# Patient Record
Sex: Female | Born: 2000 | Race: White | Hispanic: No | Marital: Single | Smoking: Never smoker
Health system: Southern US, Community
[De-identification: ages and names within clinical notes are randomized; demographics above are authoritative.]

## PROBLEM LIST (undated history)

## (undated) DIAGNOSIS — F411 Generalized anxiety disorder: Secondary | ICD-10-CM

## (undated) DIAGNOSIS — F902 Attention-deficit hyperactivity disorder, combined type: Principal | ICD-10-CM

## (undated) DIAGNOSIS — R278 Other lack of coordination: Secondary | ICD-10-CM

## (undated) HISTORY — DX: Other lack of coordination: R27.8

## (undated) HISTORY — DX: Generalized anxiety disorder: F41.1

## (undated) HISTORY — DX: Attention-deficit hyperactivity disorder, combined type: F90.2

---
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
## 2018-01-26 ENCOUNTER — Other Ambulatory Visit: Payer: Self-pay

## 2018-01-26 MED ORDER — LISDEXAMFETAMINE DIMESYLATE 30 MG PO CAPS: 30.0000 mg | ORAL_CAPSULE | Freq: Every morning | ORAL | 0 refills | Status: DC

## 2018-01-26 MED ORDER — VYVANSE 70 MG PO CAPS: 70.0000 mg | ORAL_CAPSULE | Freq: Every morning | ORAL | 0 refills | Status: DC

## 2018-01-26 NOTE — Telephone Encounter (Signed)
Mom called in for refill for Vyvanse 30mg  and 70mg . Last visit 10/28/2017 next visit 05/10/2018. [facility] on [address]

## 2018-01-26 NOTE — Telephone Encounter (Signed)
RX for above e-scribed and sent to pharmacy on record  [id number] [place], [place] - [address] [place] [address] Phone: [phone] Fax: [phone] 

## 2018-03-01 ENCOUNTER — Other Ambulatory Visit: Payer: Self-pay

## 2018-03-01 MED ORDER — VYVANSE 70 MG PO CAPS: 70.0000 mg | ORAL_CAPSULE | Freq: Every morning | ORAL | 0 refills | Status: DC

## 2018-03-01 MED ORDER — LISDEXAMFETAMINE DIMESYLATE 30 MG PO CAPS: 30.0000 mg | ORAL_CAPSULE | Freq: Every morning | ORAL | 0 refills | Status: DC

## 2018-03-01 NOTE — Telephone Encounter (Signed)
Mom called in for refill for Vyvanse 30mg and 70mg. Last visit 10/28/2017 next visit 05/10/2018. [facility] on [address] 

## 2018-03-26 ENCOUNTER — Other Ambulatory Visit: Payer: Self-pay | Admitting: Pediatrics

## 2018-03-26 NOTE — Telephone Encounter (Signed)
Buspar 10 mg daily, # 30 with 0 RF's .RX for above e-scribed and sent to pharmacy on record  [id number] [place], [place] - [address] [place] [address] Phone: [phone] Fax: [phone]  [id number] [place], [place] - [address] [place] [address] Phone: [phone] Fax: [phone]

## 2018-03-30 ENCOUNTER — Other Ambulatory Visit: Payer: Self-pay

## 2018-03-30 MED ORDER — VYVANSE 70 MG PO CAPS: 70.0000 mg | ORAL_CAPSULE | Freq: Every morning | ORAL | 0 refills | Status: DC

## 2018-03-30 MED ORDER — LISDEXAMFETAMINE DIMESYLATE 30 MG PO CAPS: 30.0000 mg | ORAL_CAPSULE | Freq: Every morning | ORAL | 0 refills | Status: DC

## 2018-03-30 NOTE — Telephone Encounter (Signed)
RX for above e-scribed and sent to pharmacy on record  [id number] [place], [place] - [address] [place] [address] Phone: [phone] Fax: [phone] 

## 2018-03-30 NOTE — Telephone Encounter (Signed)
Mom called in for refill for Vyvanse 30mg and 70mg. Last visit 10/28/2017 next visit 05/10/2018. [facility] on [address] 

## 2018-04-14 ENCOUNTER — Institutional Professional Consult (permissible substitution): Payer: BC Managed Care – PPO | Admitting: Pediatrics

## 2018-04-27 ENCOUNTER — Other Ambulatory Visit: Payer: Self-pay

## 2018-04-27 MED ORDER — LISDEXAMFETAMINE DIMESYLATE 30 MG PO CAPS: 30.0000 mg | ORAL_CAPSULE | Freq: Every morning | ORAL | 0 refills | Status: DC

## 2018-04-27 MED ORDER — VYVANSE 70 MG PO CAPS: 70.0000 mg | ORAL_CAPSULE | Freq: Every morning | ORAL | 0 refills | Status: DC

## 2018-04-27 NOTE — Telephone Encounter (Signed)
Mom called in for refill for Vyvanse 30mg  and 70mg . Last visit 10/28/2017 next visit 05/10/2018. [facility]

## 2018-04-27 NOTE — Telephone Encounter (Signed)
E-Prescribed Vyvanse 70 mg plus Vyvanse 30 mg directly to  [id number] [place], [place] - [address] [place] [id number] Phone: [phone] Fax: [phone]

## 2018-05-10 ENCOUNTER — Ambulatory Visit: Payer: BC Managed Care – PPO | Admitting: Pediatrics

## 2018-05-10 ENCOUNTER — Encounter: Payer: Self-pay | Admitting: Pediatrics

## 2018-05-10 VITALS — BP 124/78 | HR 104 | Ht 66.5 in | Wt 150.0 lb

## 2018-05-10 DIAGNOSIS — F902 Attention-deficit hyperactivity disorder, combined type: Secondary | ICD-10-CM

## 2018-05-10 DIAGNOSIS — R278 Other lack of coordination: Secondary | ICD-10-CM

## 2018-05-10 DIAGNOSIS — F422 Mixed obsessional thoughts and acts: Secondary | ICD-10-CM | POA: Diagnosis not present

## 2018-05-10 DIAGNOSIS — Z719 Counseling, unspecified: Secondary | ICD-10-CM

## 2018-05-10 DIAGNOSIS — Z7189 Other specified counseling: Secondary | ICD-10-CM

## 2018-05-10 DIAGNOSIS — Z79899 Other long term (current) drug therapy: Secondary | ICD-10-CM

## 2018-05-10 DIAGNOSIS — F411 Generalized anxiety disorder: Secondary | ICD-10-CM | POA: Diagnosis not present

## 2018-05-10 MED ORDER — AMPHETAMINE-DEXTROAMPHETAMINE 10 MG PO TABS: 10.0000 mg | ORAL_TABLET | ORAL | 0 refills | Status: DC | PRN

## 2018-05-10 MED ORDER — FLUOXETINE HCL 20 MG PO TABS: 20.0000 mg | ORAL_TABLET | Freq: Every day | ORAL | 0 refills | Status: DC

## 2018-05-10 NOTE — Progress Notes (Signed)
Patient ID: Susan Dyer, female   DOB: 08/13/2000, 17 y.o.   MRN: [id number]  Medical Follow-up  Patient ID: Susan ScoreMallory Budge  DOB: [birth year]  MRN: [id number]  DATE:05/10/18 Chales Salmonees, Janet, MD  Accompanied by: Mother Patient Lives with: mother and sister age 516 years   [facility] [employer] [facility] roommate - towards the end will keep her room but live with BF  HISTORY/CURRENT STATUS: Chief Complaint - Polite and cooperative and present for medical follow up for medication management of ADHD, dysgraphia and  Anxiety.  Currently prescribed Vyvanse 70 and 30 mg and Prozac 20 mg every morning.  Not using Buspar right now.  Had wisdom teeth out Tuesday, now has infection since Friday.  Just prescribed ABX.    EDUCATION: [employer]: [employer]  Year/Grade: Freshman year Biology for majors, Database administratorrinciples of Biology, lab, FYE, Occupational hygienistCommunications, psychology Passed Psych on the line.  Others A, B, B, A, A DSO - smart pen (returned it).  Has computer each class, extended time for test, separate setting  [employer] equestrian team - on the team but not social events the coach supported. Was riding weekly - English and jumping  Has a few friends and in class and a buddy making quizlettes  MEDICAL HISTORY: Appetite: WNL  Sleep: Bedtime: School 2400  Awakens: School no issues Sleep Concerns: Initiation/Maintenance/Other: Asleep easily, sleeps through the night, feels well-rested.  No Sleep concerns. No concerns for toileting. Daily stool, no constipation or diarrhea. Void urine no difficulty. No enuresis.   Participate in daily oral hygiene to include brushing and flossing. Individual Medical History/Review of System Changes? No Now involved with Father, he would visit for Breakfast and buy supplies - not paying for college. He is remarrying  Allergies:  No Known Allergies  Current Medications:  Vyvanse 70 and 30 mg Prozac 20 mg every morning Medication Side Effects: None  Family Medical/Social History Changes?: No  MENTAL  HEALTH: Mental Health Issues:  Denies sadness, loneliness or depression. No self harm or thoughts of self harm or injury. Denies fears, worries and anxieties. Has good peer relations and is not a bully nor is victimized.  ROS: Review of Systems  Constitutional: Negative for fatigue.  HENT: Negative.   Eyes: Negative.   Respiratory: Negative.   Cardiovascular: Negative.   Gastrointestinal: Negative.   Endocrine: Negative.   Genitourinary: Negative.   Musculoskeletal: Negative.   Skin: Negative.   Allergic/Immunologic: Negative.   Neurological: Negative for seizures and headaches.  Hematological: Negative.   Psychiatric/Behavioral: Negative for agitation, behavioral problems, decreased concentration and sleep disturbance. The patient is not nervous/anxious and is not hyperactive.   All other systems reviewed and are negative.  PHYSICAL EXAM: Vitals:   05/10/18 1421  BP: 124/78  Pulse: (!) 104  Weight: 150 lb (68 kg)  Height: 5' 6.5" (1.689 m)   Body mass index is 23.85 kg/m.  General Exam: Physical Exam Vitals signs reviewed.  Constitutional:      General: She is not in acute distress.    Appearance: Normal appearance. She is well-developed.  HENT:     Head: Normocephalic.     Right Ear: Tympanic membrane, ear canal and external ear normal.     Left Ear: Tympanic membrane, ear canal and external ear normal.     Nose: Nose normal.     Mouth/Throat:     Pharynx: Uvula midline.  Eyes:     General: Lids are normal.     Conjunctiva/sclera: Conjunctivae normal.     Pupils: Pupils are equal,  round, and reactive to light.  Neck:     Musculoskeletal: Normal range of motion.     Trachea: Trachea normal.  Cardiovascular:     Rate and Rhythm: Normal rate and regular rhythm.     Pulses: Normal pulses.     Heart sounds: Normal heart sounds.  Pulmonary:     Effort: Pulmonary effort is normal.     Breath sounds: Normal breath sounds.  Abdominal:     General: Bowel sounds  are normal.     Palpations: Abdomen is soft.  Genitourinary:    Comments: Deferred Musculoskeletal: Normal range of motion.  Skin:    General: Skin is warm and dry.  Neurological:     Mental Status: She is alert and oriented to person, place, and time.     Cranial Nerves: No cranial nerve deficit.     Sensory: No sensory deficit.     Motor: No tremor or seizure activity.     Coordination: Coordination normal.     Gait: Gait normal.     Deep Tendon Reflexes: Reflexes are normal and symmetric.  Psychiatric:        Attention and Perception: She is attentive.        Mood and Affect: Mood is not anxious or depressed.        Speech: Speech normal.        Behavior: Behavior normal. Behavior is not hyperactive. Behavior is cooperative.        Thought Content: Thought content normal. Thought content does not include suicidal ideation. Thought content does not include suicidal plan.        Judgment: Judgment normal. Judgment is not impulsive.   Neurological: oriented to place and person  Testing/Developmental Screens: CGI:13/23 Reviewed with patient and mother      DIAGNOSES:    ICD-10-CM   1. ADHD (attention deficit hyperactivity disorder), combined type F90.2   2. Dysgraphia R27.8   3. Generalized anxiety disorder F41.1   4. Mixed obsessional thoughts and acts F42.2   5. Medication management Z79.899   6. Patient counseled Z71.9   7. Parenting dynamics counseling Z71.89   8. Counseling and coordination of care Z71.89     RECOMMENDATIONS:  Patient Instructions  DISCUSSION: Patient and family counseled regarding the following coordination of care items:  Continue medication as directed Vyvanse 70 mg and 30 mg every morning Add adderall 10 mg for evening class as needed Prozac 20 mg every morning RX for above e-scribed and sent to pharmacy on record  [id number] [place], [place] - [address] [place]  [id number] Phone:  [phone] Fax: [phone]   Counseled medication administration, effects, and possible side effects.  ADHD medications discussed to include different medications and pharmacologic properties of each. Recommendation for specific medication to include dose, administration, expected effects, possible side effects and the risk to benefit ratio of medication management.  Advised importance of:  Good sleep hygiene (8- 10 hours per night) Limited screen time (none on school nights, no more than 2 hours on weekends) Regular exercise(outside and active play) Healthy eating (drink water, no sodas/sweet tea, limit portions and no seconds).  Counseling at this visit included the review of old records and/or current chart with the patient and family.   Counseling included the following discussion points presented at every visit to improve understanding and treatment compliance.  Recent health history and today's examination Growth and development with anticipatory guidance provided regarding  brain growth, executive function maturation and pubertal development School progress and continued advocay for appropriate accommodations to include maintain Structure, routine, organization, reward, motivation and consequences.  Additionally the patient was counseled to take medication while driving.    Mother verbalized understanding of all topics discussed.  NEXT APPOINTMENT: Return in about 3 months (around 08/09/2018) for Medical Follow up. Medical Decision-making: More than 50% of the appointment was spent counseling and discussing diagnosis and management of symptoms with the patient and family.  Counseling Time: 40 minutes Total Contact Time: 50 minutes

## 2018-05-10 NOTE — Patient Instructions (Addendum)
DISCUSSION: Patient and family counseled regarding the following coordination of care items:  Continue medication as directed Vyvanse 70 mg and 30 mg every morning Add adderall 10 mg for evening class as needed Prozac 20 mg every morning RX for above e-scribed and sent to pharmacy on record  [id number] [place], [place] - [address] [place] [id number] Phone: [phone] Fax: [phone]  Counseled medication administration, effects, and possible side effects.  ADHD medications discussed to include different medications and pharmacologic properties of each. Recommendation for specific medication to include dose, administration, expected effects, possible side effects and the risk to benefit ratio of medication management.  Advised importance of:  Good sleep hygiene (8- 10 hours per night) Limited screen time (none on school nights, no more than 2 hours on weekends) Regular exercise(outside and active play) Healthy eating (drink water, no sodas/sweet tea, limit portions and no seconds).  Counseling at this visit included the review of old records and/or current chart with the patient and family.   Counseling included the following discussion points presented at every visit to improve understanding and treatment compliance.  Recent health history and today's examination Growth and development with anticipatory guidance provided regarding brain growth, executive function maturation and pubertal development School progress and continued advocay for appropriate accommodations to include maintain Structure, routine, organization, reward, motivation and consequences.  Additionally the patient was counseled to take medication while driving.

## 2018-05-27 ENCOUNTER — Other Ambulatory Visit: Payer: Self-pay

## 2018-05-27 MED ORDER — VYVANSE 70 MG PO CAPS: 70.0000 mg | ORAL_CAPSULE | Freq: Every morning | ORAL | 0 refills | Status: DC

## 2018-05-27 MED ORDER — LISDEXAMFETAMINE DIMESYLATE 30 MG PO CAPS: 30.0000 mg | ORAL_CAPSULE | Freq: Every morning | ORAL | 0 refills | Status: DC

## 2018-05-27 NOTE — Telephone Encounter (Signed)
E-Prescribed Vyvanse 70 and 30 directly to  [id number] [place], [place] - [address] [place] [address] Phone: [phone] Fax: [phone]

## 2018-05-27 NOTE — Telephone Encounter (Signed)
Mom called in for refill for Vyvanse 30mg  and 70mg . Last visit 05/10/2018 next visit 11/09/2018. [facility]

## 2018-06-17 ENCOUNTER — Other Ambulatory Visit: Payer: Self-pay | Admitting: Family

## 2018-06-17 NOTE — Telephone Encounter (Signed)
RX for above e-scribed and sent to pharmacy on record ? ?[id number] [place], [place] - [address] ?[address] ?[place] [address] ?Phone: [phone] Fax: [phone] ?

## 2018-06-17 NOTE — Telephone Encounter (Signed)
Last visit 05/10/2018 next visit 11/09/2018

## 2018-07-12 ENCOUNTER — Other Ambulatory Visit: Payer: Self-pay

## 2018-07-12 MED ORDER — VYVANSE 70 MG PO CAPS: 70.0000 mg | ORAL_CAPSULE | Freq: Every morning | ORAL | 0 refills | Status: DC

## 2018-07-12 MED ORDER — LISDEXAMFETAMINE DIMESYLATE 30 MG PO CAPS: 30.0000 mg | ORAL_CAPSULE | Freq: Every morning | ORAL | 0 refills | Status: DC

## 2018-07-12 NOTE — Telephone Encounter (Signed)
Patient called in for refill for Vyvanse. Last visit 05/11/2019 next visit 11/09/2018. [facility] [place], [place]

## 2018-07-12 NOTE — Telephone Encounter (Signed)
E-Prescribed Vyvanse 70 and 30 mg directly to  [id number] [place], [place] - [address] [place] [address] Phone: [phone] Fax: [phone]

## 2018-08-09 ENCOUNTER — Other Ambulatory Visit: Payer: Self-pay

## 2018-08-09 MED ORDER — LISDEXAMFETAMINE DIMESYLATE 30 MG PO CAPS: 30.0000 mg | ORAL_CAPSULE | Freq: Every morning | ORAL | 0 refills | Status: DC

## 2018-08-09 MED ORDER — VYVANSE 70 MG PO CAPS: 70.0000 mg | ORAL_CAPSULE | Freq: Every morning | ORAL | 0 refills | Status: DC

## 2018-08-09 NOTE — Telephone Encounter (Signed)
Patient called in for refill for Vyvanse. Last visit 05/11/2019 next visit 11/09/2018. [facility] [place], [place] 

## 2018-08-09 NOTE — Telephone Encounter (Signed)
Vyvanse 70 mg and 30 mg each daily, # 30 with no RF's each Rx. RX for above e-scribed and sent to pharmacy on record  [id number] [place], [place] - [address] [place] [address] Phone: [phone] Fax: [phone]

## 2018-08-10 MED ORDER — VYVANSE 70 MG PO CAPS: 70.0000 mg | ORAL_CAPSULE | Freq: Every morning | ORAL | 0 refills | Status: DC

## 2018-08-10 MED ORDER — LISDEXAMFETAMINE DIMESYLATE 30 MG PO CAPS: 30.0000 mg | ORAL_CAPSULE | Freq: Every morning | ORAL | 0 refills | Status: DC

## 2018-08-10 NOTE — Addendum Note (Signed)
Addended by: Elvera Maria R on: 08/10/2018 09:59 AM   Modules accepted: Orders

## 2018-08-10 NOTE — Telephone Encounter (Addendum)
Patient called in and [facility] [address] [place], [place] [address]

## 2018-08-10 NOTE — Telephone Encounter (Signed)
Sent to new Pharmacy.  

## 2018-08-10 NOTE — Addendum Note (Signed)
Addended by: Burgess Estelle on: 08/10/2018 09:28 AM   Modules accepted: Orders

## 2018-08-11 ENCOUNTER — Telehealth: Payer: Self-pay | Admitting: Pediatrics

## 2018-08-11 NOTE — Telephone Encounter (Signed)
Spoke with mother regarding patient visiting girlfriend in [place]. Mother had concerns due to age differences and that she has not met with or knew of girlfriend. Mother did not want patient to go during covid19, so she refused to let her take her car.  Girlfriend came and picked up patient last night.  Counseled mother for how to handle this issue.  Coach and advise, keep assumptions low and support gently. Mother verbalized understanding of all topics discussed.

## 2018-09-08 ENCOUNTER — Other Ambulatory Visit: Payer: Self-pay

## 2018-09-08 MED ORDER — VYVANSE 70 MG PO CAPS: 70.0000 mg | ORAL_CAPSULE | Freq: Every morning | ORAL | 0 refills | Status: DC

## 2018-09-08 NOTE — Telephone Encounter (Signed)
RX for above e-scribed and sent to pharmacy on record  [id number] [place], [place] - [address] [place] [address] Phone: [phone] Fax: [phone]    

## 2018-09-08 NOTE — Telephone Encounter (Signed)
Patient called in for refill for Vyvanse. Last visit 05/10/2018 next visit 11/09/2018. [facility] [address] [place], [place] [address]

## 2018-09-13 ENCOUNTER — Other Ambulatory Visit: Payer: Self-pay

## 2018-09-13 MED ORDER — FLUOXETINE HCL 20 MG PO TABS: 20.0000 mg | ORAL_TABLET | Freq: Every day | ORAL | 0 refills | Status: DC

## 2018-09-13 MED ORDER — LISDEXAMFETAMINE DIMESYLATE 30 MG PO CAPS: 30.0000 mg | ORAL_CAPSULE | Freq: Every morning | ORAL | 0 refills | Status: DC

## 2018-09-13 MED ORDER — BUSPIRONE HCL 10 MG PO TABS: ORAL_TABLET | ORAL | 0 refills | Status: DC

## 2018-09-13 NOTE — Telephone Encounter (Signed)
Patient called in for refill for Vyvanse 30 and Prozac.

## 2018-09-13 NOTE — Telephone Encounter (Signed)
E-Prescribed Vyvanse 30 mg, Prozac and Buspirone directly to  [id number] [place], [place] - [address] [place] [address] Phone: [phone] Fax: [phone]

## 2018-09-13 NOTE — Telephone Encounter (Signed)
Pharm faxed in refill request for Buspirone. Last visit 05/11/2019 next visit 11/09/2018

## 2018-10-12 ENCOUNTER — Other Ambulatory Visit: Payer: Self-pay

## 2018-10-12 MED ORDER — VYVANSE 70 MG PO CAPS: 70.0000 mg | ORAL_CAPSULE | Freq: Every morning | ORAL | 0 refills | Status: DC

## 2018-10-12 MED ORDER — LISDEXAMFETAMINE DIMESYLATE 30 MG PO CAPS: 30.0000 mg | ORAL_CAPSULE | Freq: Every morning | ORAL | 0 refills | Status: DC

## 2018-10-12 NOTE — Telephone Encounter (Signed)
Mom called in for refill for Vyvanse. Last visit 05/11/2019 next visit 11/09/2018. [facility] [place], [place]

## 2018-10-12 NOTE — Telephone Encounter (Signed)
RX for above e-scribed and sent to pharmacy on record ? ?[id number] [place], [place] - [address] ?[address] ?[place] [address] ?Phone: [phone] Fax: [phone] ?

## 2018-10-14 ENCOUNTER — Ambulatory Visit (INDEPENDENT_AMBULATORY_CARE_PROVIDER_SITE_OTHER): Payer: BC Managed Care – PPO | Admitting: Pediatrics

## 2018-10-14 ENCOUNTER — Encounter: Payer: Self-pay | Admitting: Pediatrics

## 2018-10-14 ENCOUNTER — Other Ambulatory Visit: Payer: Self-pay

## 2018-10-14 VITALS — Wt 154.0 lb

## 2018-10-14 DIAGNOSIS — R278 Other lack of coordination: Secondary | ICD-10-CM

## 2018-10-14 DIAGNOSIS — F902 Attention-deficit hyperactivity disorder, combined type: Secondary | ICD-10-CM | POA: Diagnosis not present

## 2018-10-14 DIAGNOSIS — F411 Generalized anxiety disorder: Secondary | ICD-10-CM | POA: Diagnosis not present

## 2018-10-14 DIAGNOSIS — F422 Mixed obsessional thoughts and acts: Secondary | ICD-10-CM

## 2018-10-14 DIAGNOSIS — Z7189 Other specified counseling: Secondary | ICD-10-CM

## 2018-10-14 DIAGNOSIS — Z79899 Other long term (current) drug therapy: Secondary | ICD-10-CM

## 2018-10-14 DIAGNOSIS — Z719 Counseling, unspecified: Secondary | ICD-10-CM

## 2018-10-14 NOTE — Progress Notes (Signed)
[facility] [address] [place] [address] Dept: [phone] Dept Fax: [phone] Loc: [phone] Loc Fax: [phone]  Medical Follow-up  Patient ID: Susan Dyer, female  DOB: November 16, 2000, 18 y.o.  MRN: [id number]  Date of Evaluation: 10/14/2018  PCP: [name], MD  Accompanied by: Self Patient Lives with: mother, sister 75 years Father has separate household, largely uninvolved  Living situation recently changed.  Was at [employer] for spring semester, when school ended in person classes due to Covid19.  Then plan was to live in [place] with her girl friend, her mother expected her to stay at home.  Some tense altercations, but Sherie left to live with girlfriend [name] and her 95 year old son, Simonne Come.  They live with Lindsey's mother.  Was with them until Saturday 10/10/2018 and is now at home with mother due to financial issues for Lindsey's mother and Lindsey's brother who is now going to live there.  At home with Mother, Brelynn is having relationship issues of mother not accepting her same sex relationship, Christalyn and [name] are engaged.  Is now staying with a local friend and may move to live with her boss this weekend.  HISTORY/CURRENT STATUS:  Chief Complaint - Polite and cooperative and present for medical follow up for medication management of ADHD, dysgraphia and requested early follow up due to life stress and current relationship issues.    EDUCATION: School: [employer]  Year/Grade: Kizzie Furnish with A/B grades in biology, chemistry both with labs, television appreciation and Eng.  Was on-line since March 15th, and completed on line learning.  Did not mind the platform but prefers daily routine of being at school.  MEDICAL HISTORY: Challenges with not eating regular meals, having a lot of junk and fast food lately  Sleep:  Bedtime: problems falling asleep, spinning in mentally with worries and "what if" Awakens: later in the morning and some days not taking medication until the afternoon around 1300. Sleep Concerns: Initiation/Maintenance/Other: issues, counseled to keep routine and schedule sleep and awake. May use melatonin. Counseled regarding stress and relaxation.  Individual Medical History/Review of System Changes? No  Allergies: Patient has no known allergies.  Current Medications:  Vyvanse 70 mg and Vyvanse 30 mg every morning Prozac 20 mg every morning  Not taking buspar or adderall. Medication Side Effects: None  Family Medical/Social History Changes?: Yes as discussed above. Mother is not accepting of same sex relationship.  Counseled regarding love languages and mother's perspective.  MENTAL HEALTH: Mental Health Issues:  Feels excessive anxiety and worry especially in the evening when attempting to fall asleep. Counseled methods or relaxation, and elements of CBT.  Counseled to see counselor and stabilize living situation.  PHYSICAL EXAM: Vitals:  Today's Vitals   10/14/18 1113  Weight: 154 lb (69.9 kg)  , 76 %ile (Z= 0.71) based on CDC (Girls, 2-20 Years) BMI-for-age data using weight from 10/14/2018 and height from 05/10/2018.  General Exam: Physical Exam Vitals signs reviewed.  Constitutional:      General: She is not in acute distress.    Appearance: Normal appearance. She is well-developed.  HENT:     Head: Normocephalic.     Nose: Nose normal.  Eyes:     General: Lids are normal.     Conjunctiva/sclera: Conjunctivae normal.     Pupils: Pupils are equal, round, and reactive to light.  Neck:     Musculoskeletal: Normal range  of motion.  Pulmonary:     Effort: Pulmonary effort is normal.  Genitourinary:    Comments: Deferred Musculoskeletal: Normal range of motion.  Skin:    General: Skin is warm and dry.  Neurological:     Mental Status: She is alert and oriented to  person, place, and time.     Cranial Nerves: Cranial nerves are intact.     Sensory: Sensation is intact.     Motor: Motor function is intact.     Coordination: Coordination is intact.     Gait: Gait is intact.  Psychiatric:        Attention and Perception: Attention and perception normal.        Mood and Affect: Affect normal. Mood is anxious.        Speech: Speech normal.        Behavior: Behavior normal. Behavior is cooperative.        Thought Content: Thought content normal. Thought content does not include suicidal ideation. Thought content does not include suicidal plan.        Cognition and Memory: Cognition normal.        Judgment: Judgment normal.     DIAGNOSES:    ICD-10-CM   1. ADHD (attention deficit hyperactivity disorder), combined type F90.2   2. Dysgraphia R27.8   3. Generalized anxiety disorder F41.1   4. Mixed obsessional thoughts and acts F42.2   5. Medication management Z79.899   6. Patient counseled Z71.9   7. Counseling and coordination of care Z71.89     RECOMMENDATIONS:  Patient Instructions  DISCUSSION: Counseled regarding the following coordination of care items:  Continue medication as directed Vyvanse 70 mg every morning Vyvanse 30 mg every morning Prozac 20 mg every morning Buspar 10 mg every evening  RX for above e-scribed and sent to pharmacy on record  [id number] [place], [place] - [address] [place] [id number] Phone: [phone] Fax: [phone]  Counseled medication administration, effects, and possible side effects.  ADHD medications discussed to include different medications and pharmacologic properties of each. Recommendation for specific medication to include dose, administration, expected effects, possible side effects and the risk to benefit ratio of medication management.  Advised importance of:  Good sleep hygiene (8- 10 hours per night) Schedule sleep, no later than  2400 and up by 0900 Limited screen time (none on school nights, no more than 2 hours on weekends) No late night texting/talking etc.  Regular exercise(outside and active play) Daily physical activity Healthy eating (drink water, no sodas/sweet tea)  Counseled how to obtain counselor, names provided. Counseled to speak to employer regarding summer hours and/or apply for unemployment. Relationship counseling with [employer] as framework for communication.     Patient verbalized understanding of all topics discussed.  NEXT APPOINTMENT: Return in about 3 months (around 01/14/2019) for Medication Check.  Medical Decision-making: More than 50% of the appointment was spent counseling and discussing diagnosis and management of symptoms with the patient.   [name] PennaBobi A Sreshta Cressler, NP Counseling Time: 40 Total Contact Time: 40 In office visit completed on this date.

## 2018-10-14 NOTE — Progress Notes (Signed)
Mother called stating pharmacy refused to fill long standing rx for Vyvanse 70 mg and 30 mg. I personally spoke with pharmacist, who continued to refuse to fill medication.  Complaint filed with [place] board of pharmacy: confirmation number:  [id number]

## 2018-10-14 NOTE — Patient Instructions (Signed)
DISCUSSION: Counseled regarding the following coordination of care items:  Continue medication as directed Vyvanse 70 mg every morning Vyvanse 30 mg every morning Prozac 20 mg every morning Buspar 10 mg every evening  RX for above e-scribed and sent to pharmacy on record  [id number] [place], [place] - [address] [place] [address] Phone: [phone] Fax: [phone]  Counseled medication administration, effects, and possible side effects.  ADHD medications discussed to include different medications and pharmacologic properties of each. Recommendation for specific medication to include dose, administration, expected effects, possible side effects and the risk to benefit ratio of medication management.  Advised importance of:  Good sleep hygiene (8- 10 hours per night) Schedule sleep, no later than 2400 and up by 0900 Limited screen time (none on school nights, no more than 2 hours on weekends) No late night texting/talking etc.  Regular exercise(outside and active play) Daily physical activity Healthy eating (drink water, no sodas/sweet tea)  Counseled how to obtain counselor, names provided. Counseled to speak to employer regarding summer hours and/or apply for unemployment. Relationship counseling with [employer] as framework for communication.

## 2018-10-15 ENCOUNTER — Other Ambulatory Visit: Payer: Self-pay

## 2018-10-15 MED ORDER — LISDEXAMFETAMINE DIMESYLATE 30 MG PO CAPS: 30.0000 mg | ORAL_CAPSULE | Freq: Every morning | ORAL | 0 refills | Status: DC

## 2018-10-15 MED ORDER — BUSPIRONE HCL 10 MG PO TABS: ORAL_TABLET | ORAL | 0 refills | Status: DC

## 2018-10-15 NOTE — Telephone Encounter (Signed)
Mom called in for refill for Vyvanse 30mg  and Buspar. Last visit 10/14/2018 next visit 01/19/2019. Please escribe to [facility] in [place], [place]

## 2018-10-15 NOTE — Telephone Encounter (Signed)
RX for above e-scribed and sent to pharmacy on record   [id number] - [place], [place] - [address] [place] [address] Phone: [phone] Fax: [phone]   

## 2018-11-09 ENCOUNTER — Encounter: Payer: BC Managed Care – PPO | Admitting: Pediatrics

## 2018-11-11 ENCOUNTER — Other Ambulatory Visit: Payer: Self-pay

## 2018-11-11 MED ORDER — VYVANSE 70 MG PO CAPS: 70.0000 mg | ORAL_CAPSULE | Freq: Every morning | ORAL | 0 refills | Status: DC

## 2018-11-11 MED ORDER — LISDEXAMFETAMINE DIMESYLATE 30 MG PO CAPS: 30.0000 mg | ORAL_CAPSULE | Freq: Every morning | ORAL | 0 refills | Status: DC

## 2018-11-11 NOTE — Telephone Encounter (Signed)
RX for above e-scribed and sent to pharmacy on record   [id number] - [place], [place] - [address] [place] [address] Phone: [phone] Fax: [phone]   

## 2018-11-11 NOTE — Telephone Encounter (Signed)
Mom called in for refill for Vyvanse. Last visit 10/14/2018 next visit 01/19/2019. Please escribe to [facility] in [place], [place]

## 2018-11-17 ENCOUNTER — Other Ambulatory Visit: Payer: Self-pay | Admitting: Pediatrics

## 2018-11-17 NOTE — Telephone Encounter (Signed)
Patient called for refill for Vyvanse 30 mg.  Patient last seen 10/14/18, next appointment 01/19/19.  Please e-scribe to [place], [place].

## 2018-11-17 NOTE — Telephone Encounter (Signed)
Left message for patient [phone] and mother [phone] that both Vyvanse 70 mg and 30 mg were sent to pharmacy on 11/11/2018.  If she only picked up the 70 mg, she may need to call the pharmacy to have them prep the 30 mg.  Lm for them to call back with other issues.

## 2018-12-06 ENCOUNTER — Other Ambulatory Visit: Payer: Self-pay | Admitting: Pediatrics

## 2018-12-06 NOTE — Telephone Encounter (Signed)
E-Prescribed Prozac and buspirone directly to  [id number] [place], [place] - [place] [address] Phone: [phone] Fax: [phone]

## 2018-12-06 NOTE — Telephone Encounter (Signed)
Last visit 10/14/2018 next visit 01/19/2019

## 2018-12-09 ENCOUNTER — Other Ambulatory Visit: Payer: Self-pay

## 2018-12-09 NOTE — Telephone Encounter (Signed)
Patient called for refill for Vyvanse.  Last visit 10/14/18 Next visit 01/19/19.  Please Escribe to [facility] in [place].

## 2018-12-10 MED ORDER — LISDEXAMFETAMINE DIMESYLATE 30 MG PO CAPS: 30.0000 mg | ORAL_CAPSULE | Freq: Every morning | ORAL | 0 refills | Status: DC

## 2018-12-10 MED ORDER — VYVANSE 70 MG PO CAPS: 70.0000 mg | ORAL_CAPSULE | Freq: Every morning | ORAL | 0 refills | Status: DC

## 2018-12-10 NOTE — Telephone Encounter (Signed)
Vyvanse 70 mg and 30 mg each Rx # 30 with no RF's. RX for above e-scribed and sent to pharmacy on record   [place], [place] [address] Phone: [phone] Fax: [phone]

## 2018-12-16 NOTE — Addendum Note (Signed)
Addended by: Venetia Maxon on: 12/16/2018 04:12 PM   Modules accepted: Orders

## 2018-12-16 NOTE — Addendum Note (Signed)
Addended by: Venetia Maxon on: 12/16/2018 04:11 PM   Modules accepted: Orders

## 2018-12-16 NOTE — Telephone Encounter (Signed)
Patient would liike med sent to [facility] in [place], [place]

## 2018-12-17 MED ORDER — FLUOXETINE HCL 20 MG PO TABS: 20.0000 mg | ORAL_TABLET | Freq: Every day | ORAL | 0 refills | Status: DC

## 2018-12-17 NOTE — Telephone Encounter (Signed)
Prozac 20 mg daily, # 90 with no RF's. RX for above e-scribed and sent to pharmacy on record  [place] ([place]), [place] [address] Phone: [phone] Fax: [phone]

## 2018-12-17 NOTE — Addendum Note (Signed)
Addended by: Carolann Littler on: 12/17/2018 12:39 PM   Modules accepted: Orders

## 2019-01-11 ENCOUNTER — Other Ambulatory Visit: Payer: Self-pay

## 2019-01-11 MED ORDER — LISDEXAMFETAMINE DIMESYLATE 30 MG PO CAPS: 30.0000 mg | ORAL_CAPSULE | Freq: Every morning | ORAL | 0 refills | Status: DC

## 2019-01-11 MED ORDER — VYVANSE 70 MG PO CAPS: 70.0000 mg | ORAL_CAPSULE | Freq: Every morning | ORAL | 0 refills | Status: DC

## 2019-01-11 NOTE — Telephone Encounter (Signed)
RX for above e-scribed and sent to pharmacy on record   [id number] - [place], [place] - [address] [place] [address] Phone: [phone] Fax: [phone]   

## 2019-01-11 NOTE — Telephone Encounter (Signed)
Patient called for refill for Vyvanse.  Last visit 5/28/220 Next visit 01/19/19.  Please [facility]scribe to [facility] in [place].

## 2019-01-19 ENCOUNTER — Encounter: Payer: BC Managed Care – PPO | Admitting: Pediatrics

## 2019-01-27 ENCOUNTER — Other Ambulatory Visit: Payer: Self-pay

## 2019-01-27 MED ORDER — AMPHETAMINE-DEXTROAMPHETAMINE 10 MG PO TABS: 10.0000 mg | ORAL_TABLET | ORAL | 0 refills | Status: DC | PRN

## 2019-01-27 NOTE — Telephone Encounter (Signed)
RX for above e-scribed and sent to pharmacy on record  [id number] - [place], [place] - [address]  [place] [address] Phone: [phone] Fax: [phone] 

## 2019-01-27 NOTE — Telephone Encounter (Signed)
Patient called for refill for Adderall. Last visit 5/28/220 Next visit 02/08/19. Please e-scribe to [facility] [place] on [address]

## 2019-02-08 ENCOUNTER — Ambulatory Visit (INDEPENDENT_AMBULATORY_CARE_PROVIDER_SITE_OTHER): Payer: BC Managed Care – PPO | Admitting: Pediatrics

## 2019-02-08 ENCOUNTER — Encounter: Payer: Self-pay | Admitting: Pediatrics

## 2019-02-08 ENCOUNTER — Other Ambulatory Visit: Payer: Self-pay

## 2019-02-08 DIAGNOSIS — F422 Mixed obsessional thoughts and acts: Secondary | ICD-10-CM | POA: Diagnosis not present

## 2019-02-08 DIAGNOSIS — Z719 Counseling, unspecified: Secondary | ICD-10-CM

## 2019-02-08 DIAGNOSIS — R278 Other lack of coordination: Secondary | ICD-10-CM

## 2019-02-08 DIAGNOSIS — F411 Generalized anxiety disorder: Secondary | ICD-10-CM | POA: Diagnosis not present

## 2019-02-08 DIAGNOSIS — Z7189 Other specified counseling: Secondary | ICD-10-CM

## 2019-02-08 DIAGNOSIS — F902 Attention-deficit hyperactivity disorder, combined type: Secondary | ICD-10-CM

## 2019-02-08 DIAGNOSIS — Z79899 Other long term (current) drug therapy: Secondary | ICD-10-CM

## 2019-02-08 MED ORDER — FLUOXETINE HCL 20 MG PO TABS: 20.0000 mg | ORAL_TABLET | Freq: Every day | ORAL | 0 refills | Status: DC

## 2019-02-08 MED ORDER — LISDEXAMFETAMINE DIMESYLATE 30 MG PO CAPS: 30.0000 mg | ORAL_CAPSULE | Freq: Every morning | ORAL | 0 refills | Status: DC

## 2019-02-08 MED ORDER — BUSPIRONE HCL 10 MG PO TABS: 10.0000 mg | ORAL_TABLET | Freq: Every evening | ORAL | 0 refills | Status: DC

## 2019-02-08 MED ORDER — VYVANSE 70 MG PO CAPS: 70.0000 mg | ORAL_CAPSULE | Freq: Every morning | ORAL | 0 refills | Status: DC

## 2019-02-08 NOTE — Patient Instructions (Addendum)
DISCUSSION: Counseled regarding the following coordination of care items:  Continue medication as directed Vyvanse 70 mg and 30 mg every morning Prozac 20 mg every morning Restart Buspar 10 mg every evening Adderall 10 mg as needed for evening work.  RX for above e-scribed and sent to pharmacy on record  [id number] [place], [place] - [place] [address] Phone: [phone] Fax: [phone]  Counseled medication administration, effects, and possible side effects.  ADHD medications discussed to include different medications and pharmacologic properties of each. Recommendation for specific medication to include dose, administration, expected effects, possible side effects and the risk to benefit ratio of medication management.  Advised importance of:  Good sleep hygiene (8- 10 hours per night)  Limited screen time (none on school nights, no more than 2 hours on weekends)  Regular exercise(outside and active play)  Healthy eating (drink water, no sodas/sweet tea)

## 2019-02-08 NOTE — Progress Notes (Signed)
[facility]. [address] [place] [address] Dept: [phone] Dept Fax: [phone]  Medication Check by FaceTime due to COVID-19  Patient ID:  Susan Dyer  female DOB: Mar 15, 2001   18 y.o.   MRN: [id number]   DATE:02/09/19  PCP: Harrie Jeans, MD  Interviewed: [place] mother available on email Location: Dorm Room Provider location: [facility] office  Virtual Visit via Video Note Connected with [employer] on 02/09/19 at  2:00 PM EDT by video enabled telemedicine application and verified that I am speaking with the correct person using two identifiers.     I discussed the limitations, risks, security and privacy concerns of performing an evaluation and management service by telephone and the availability of in person appointments. I also discussed with the parent/patient that there may be a patient responsible charge related to this service. The parent/patient expressed understanding and agreed to proceed.  HISTORY OF PRESENT ILLNESS/CURRENT STATUS: Susan Dyer is being followed for medication management for ADHD, dysgraphia and anxiety with OCD.   Last visit on 10/14/2018 in office.  [place] currently prescribed  Vyvanse 70 mg and 30 mg every morning Prozac 20 mg every morning Not taking Buspar 10 mg every evening Adderall 10 mg as needed for evening work.    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 Working every other weekend. Saw GF Ria Comment often - things are good with their relationship Mother is still not accepting.  But trying to make it better. Drama with Ria Comment and mother caused by Ria Comment. Emailed mother tips on working with her relationship with [employer], coach and advise rather than dictate, etc. Mother receptive to insight. Eating  well (eating breakfast, lunch and dinner).   Sleeping: bedtime variable - some challenges falling asleep lately and has been off the buspar. counseled to begin again with half tablet for a few days then increase.  Take in the evening.  This will help fall asleep Sleeping through the night.   EDUCATION: School: [employer] G Year/Grade: Sophomore year Moved into dorm end of August - own room, bathroom and shares with one other person.  Has A four person suite, with two bathrooms.  Six classes this semester M - in person - spanish, with tutoring T - in person - biology/ecology and Chem in evening - on computer (very chaotic) W - Chem tutoring and In person - Spanish, then every other week Biology lab - in - person (variable) Th - all on line biology and chem lab (every other week it will be in person or virtual) F - Spanish  Has history on-line to finish independently.  40 min power point. Has had two exams - seems to be doing well.  Activities/ Exercise: daily  Screen time: (phone, tablet, TV, computer): non-essential, not excessive  MEDICAL HISTORY: Individual Medical History/ Review of Systems: Changes? :No  Family Medical/ Social History: Changes? Yes challenges with mother telling her what to do and not do. Counseled regarding navigating relationships as new and young adults.   Patient Lives with: self in dorm, one person shares bathroom. Has her own room.  Current Medications:  Vyvanse 70 mg and 30 mg every morning Prozac 20 mg every morning Adderall 10 mg as needed for evening work.  Medication Side Effects:  Tics and Sleep Problems  Skin picking at scalp and forehead - tic-like doing when bored or tired Counseled to restart buspar and to keep vaseline to help remind to not pick and to soften scabs to itch less.  MENTAL HEALTH: Mental Health Issues:    Denies sadness, loneliness or depression. No self harm or thoughts of self harm or injury. Denies fears, worries and anxieties. Has  good peer relations and is not a bully nor is victimized. Coping seems okay this visit, less overall stress.  DIAGNOSES:    ICD-10-CM   1. ADHD (attention deficit hyperactivity disorder), combined type  F90.2   2. Generalized anxiety disorder  F41.1   3. Dysgraphia  R27.8   4. Mixed obsessional thoughts and acts  F42.2   5. Medication management  Z79.899   6. Patient counseled  Z71.9   7. Parenting dynamics counseling  Z71.89   8. Counseling and coordination of care  Z71.89      RECOMMENDATIONS:  Patient Instructions  DISCUSSION: Counseled regarding the following coordination of care items:  Continue medication as directed Vyvanse 70 mg and 30 mg every morning Prozac 20 mg every morning Restart Buspar 10 mg every evening Adderall 10 mg as needed for evening work.  RX for above e-scribed and sent to pharmacy on record  [phone] [place], [place] - [address] [place] [address] Phone: [phone] Fax: [phone]  Counseled medication administration, effects, and possible side effects.  ADHD medications discussed to include different medications and pharmacologic properties of each. Recommendation for specific medication to include dose, administration, expected effects, possible side effects and the risk to benefit ratio of medication management.  Advised importance of:  Good sleep hygiene (8- 10 hours per night)  Limited screen time (none on school nights, no more than 2 hours on weekends)  Regular exercise(outside and active play)  Healthy eating (drink water, no sodas/sweet tea)    Discussed continued need for routine, structure, motivation, reward and positive reinforcement  Encouraged recommended limitations on TV, tablets, phones, video games and computers for non-educational activities.  Encouraged physical activity and outdoor play, maintaining social distancing.  Discussed how to talk to anxious children about  coronavirus.   Referred to [employer] for resources about engaging children who are at home in home and online study.    NEXT APPOINTMENT:  Return in about 3 months (around 05/10/2019) for Medication Check. Please call the office for a sooner appointment if problems arise.  Medical Decision-making: More than 50% of the appointment was spent counseling and discussing diagnosis and management of symptoms with the parent/patient.  I discussed the assessment and treatment plan with the parent. The parent/patient was provided an opportunity to ask questions and all were answered. The parent/patient agreed with the plan and demonstrated an understanding of the instructions.   The parent/patient was advised to call back or seek an in-person evaluation if the symptoms worsen or if the condition fails to improve as anticipated.  I provided 25 minutes of non-face-to-face time during this encounter.   Completed record review for 0 minutes prior to the virtual video visit.   [name] Penna, NP  Counseling Time: 25 minutes   Total Contact Time: 25 minutes

## 2019-03-04 ENCOUNTER — Other Ambulatory Visit: Payer: Self-pay

## 2019-03-04 MED ORDER — VYVANSE 70 MG PO CAPS: 70.0000 mg | ORAL_CAPSULE | Freq: Every morning | ORAL | 0 refills | Status: DC

## 2019-03-04 MED ORDER — AMPHETAMINE-DEXTROAMPHETAMINE 10 MG PO TABS: 10.0000 mg | ORAL_TABLET | ORAL | 0 refills | Status: DC | PRN

## 2019-03-04 MED ORDER — LISDEXAMFETAMINE DIMESYLATE 30 MG PO CAPS: 30.0000 mg | ORAL_CAPSULE | Freq: Every morning | ORAL | 0 refills | Status: DC

## 2019-03-04 NOTE — Telephone Encounter (Signed)
Patient called for refill for Adderall and Vyvanse.Last visit 02/08/2019 Next visit12/22/20. [facility] [place] [place]

## 2019-03-04 NOTE — Telephone Encounter (Signed)
Vyvanse 70 mg and 30 mg each in the morning, # 30 each rx with no Rf's and Adderall 10 mg in the afternoon, # 30 with no RF's. RX for above e-scribed and sent to pharmacy on record  [id number] [place], [place] - [place] [address] Phone: [phone] Fax: [phone]

## 2019-03-16 MED ORDER — VYVANSE 70 MG PO CAPS: 70.0000 mg | ORAL_CAPSULE | Freq: Every morning | ORAL | 0 refills | Status: DC

## 2019-03-16 MED ORDER — LISDEXAMFETAMINE DIMESYLATE 30 MG PO CAPS: 30.0000 mg | ORAL_CAPSULE | Freq: Every morning | ORAL | 0 refills | Status: DC

## 2019-03-16 NOTE — Addendum Note (Signed)
Addended by: Venetia Maxon on: 03/16/2019 12:41 PM   Modules accepted: Orders

## 2019-03-16 NOTE — Telephone Encounter (Addendum)
Patient called in stating tha she is now in [place] and just [facility] [address]

## 2019-03-16 NOTE — Telephone Encounter (Signed)
E-Prescribed Vyvanse 70 and 30 directly to  [id number] [place], [place] [address] Phone: [phone] Fax: [phone]

## 2019-03-16 NOTE — Addendum Note (Signed)
Addended by: Carmon Sails R on: 03/16/2019 02:54 PM   Modules accepted: Orders

## 2019-03-25 ENCOUNTER — Other Ambulatory Visit: Payer: Self-pay

## 2019-03-25 MED ORDER — FLUOXETINE HCL 20 MG PO TABS: 20.0000 mg | ORAL_TABLET | Freq: Every morning | ORAL | 0 refills | Status: DC

## 2019-03-25 NOTE — Telephone Encounter (Signed)
RX for above e-scribed and sent to pharmacy on record  [id number] - [place], [place] - [address] [place] [address] Phone: [phone] Fax: [phone]   

## 2019-03-25 NOTE — Telephone Encounter (Signed)
Patient would like Prozac sent to [facility] instead of [facility] [address]

## 2019-03-28 MED ORDER — FLUOXETINE HCL 20 MG PO CAPS: 20.0000 mg | ORAL_CAPSULE | Freq: Every day | ORAL | 0 refills | Status: DC

## 2019-03-28 NOTE — Addendum Note (Signed)
Addended by: Carmon Sails R on: 03/28/2019 01:09 PM   Modules accepted: Orders

## 2019-03-28 NOTE — Telephone Encounter (Signed)
Pharm faxed in that patient would like to switch to capsules of the Prozac due to tabs being to expensive. Spoke with Provider and she is fine with switch.

## 2019-03-28 NOTE — Addendum Note (Signed)
Addended by: Venetia Maxon on: 03/28/2019 01:00 PM   Modules accepted: Orders

## 2019-03-28 NOTE — Telephone Encounter (Signed)
Change fluoxetine tablets to capsules

## 2019-03-28 NOTE — Addendum Note (Signed)
Addended by: Venetia Maxon on: 03/28/2019 11:46 AM   Modules accepted: Orders

## 2019-04-07 ENCOUNTER — Other Ambulatory Visit: Payer: Self-pay

## 2019-04-07 ENCOUNTER — Other Ambulatory Visit: Payer: Self-pay | Admitting: Pediatrics

## 2019-04-07 ENCOUNTER — Ambulatory Visit
Admission: RE | Admit: 2019-04-07 | Discharge: 2019-04-07 | Disposition: A | Discharge status: Home / Self Care | Payer: BC Managed Care – PPO | Source: Ambulatory Visit | Attending: Pediatrics | Admitting: Pediatrics

## 2019-04-07 DIAGNOSIS — R079 Chest pain, unspecified: Secondary | ICD-10-CM

## 2019-04-07 MED ORDER — AMPHETAMINE-DEXTROAMPHETAMINE 10 MG PO TABS: 10.0000 mg | ORAL_TABLET | ORAL | 0 refills | Status: DC | PRN

## 2019-04-07 NOTE — Progress Notes (Signed)
Cardiology Office Note:    Date:  04/08/2019   ID:  Susan Dyer, DOB [birth year], MRN [id number]  PCP:  [name], MD  Cardiologist:  Little Ishikawa, MD  Electrophysiologist:  None   Referring MD: [name], MD   Chief Complaint  Patient presents with  . Palpitations  . Chest Pain    History of Present Illness:    Susan Dyer is a 18 y.o. female with a hx of ADHD who is referred by Dr. Avis Epley for evaluation of chest pain and palpitations.  She reports that she has been having episodes where her heart rate will get up to 180s to 190s at rest.  States that she has chest pain during these episodes.  She denies chest pain outside of these episodes, and reports that palpitations always proceeds the chest pain.  States that the episodes occur daily.  Pain in chest is left-sided.  She denies any chest pain or palpitations related to exertion, always occurs at rest.  She takes Adderall daily for her ADHD.  No smoking history.  No family history of heart disease.   Past Medical History:  Diagnosis Date  . ADHD (attention deficit hyperactivity disorder), combined type 08/01/2015  . Dysgraphia 08/01/2015  . Generalized anxiety disorder 08/01/2015    No past surgical history on file.  Current Medications: Current Meds  Medication Sig  . amphetamine-dextroamphetamine (ADDERALL) 10 MG tablet Take 1 tablet (10 mg total) by mouth as needed (evening classes).  Marland Kitchen FLUoxetine (PROZAC) 20 MG capsule Take 1 capsule (20 mg total) by mouth daily.  Marland Kitchen [employer] 1/35 1-35 MG-MCG tablet Take 1 tablet by mouth daily.  Marland Kitchen lisdexamfetamine (VYVANSE) 30 MG capsule Take 1 capsule (30 mg total) by mouth every morning.  Marland Kitchen VYVANSE 70 MG capsule Take 1 capsule (70 mg total) by mouth every morning.     Allergies:   Patient has no known allergies.   Social History   Socioeconomic History  . Marital status: Single    Spouse name: Not on file  . Number of children: Not on file  . Years of education: Not  on file  . Highest education level: Not on file  Occupational History  . Not on file  Social Needs  . Financial resource strain: Not on file  . Food insecurity    Worry: Not on file    Inability: Not on file  . Transportation needs    Medical: Not on file    Non-medical: Not on file  Tobacco Use  . Smoking status: Never Smoker  . Smokeless tobacco: Never Used  Substance and Sexual Activity  . Alcohol use: No    Alcohol/week: 0.0 standard drinks  . Drug use: No  . Sexual activity: Never  Lifestyle  . Physical activity    Days per week: Not on file    Minutes per session: Not on file  . Stress: Not on file  Relationships  . Social connections    Talks on phone: More than three times a week    Gets together: More than three times a week    Attends religious service: Never    Active member of club or organization: Yes    Attends meetings of clubs or organizations: More than 4 times per year    Relationship status: Never married  Other Topics Concern  . Not on file  Social History Narrative   Lives with mother and sister.  Biologic father uninvolved.  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  Family History: The patient's family history includes ADD / ADHD in her sister; Mental illness in her father.  ROS:   Please see the history of present illness.     All other systems reviewed and are negative.  EKGs/Labs/Other Studies Reviewed:    The following studies were reviewed today:   EKG:  EKG is  ordered today.  The ekg ordered today demonstrates normal sinus rhythm, rate 79, no ST/T abnormalities  Recent Labs: No results found for requested labs within last 8760 hours.  Recent Lipid Panel No results found for: CHOL, TRIG, HDL, CHOLHDL, VLDL, LDLCALC, LDLDIRECT  Physical Exam:    VS:  BP 134/80   Pulse 79   Ht 5\' 6"  (1.676 m)   Wt 171 lb 3.2 oz (77.7 kg)   LMP 03/24/2019 (Approximate)   SpO2 96%   BMI 27.63 kg/m     Wt Readings from Last 3 Encounters:  04/08/19 171 lb 3.2 oz  (77.7 kg) (92 %, Z= 1.40)*   * Growth percentiles are based on CDC (Girls, 2-20 Years) data.     GEN:  Well nourished, well developed in no acute distress HEENT: Normal NECK: No JVD; No carotid bruits LYMPHATICS: No lymphadenopathy CARDIAC: RRR, no murmurs, rubs, gallops RESPIRATORY:  Clear to auscultation without rales, wheezing or rhonchi  ABDOMEN: Soft, non-tender, non-distended MUSCULOSKELETAL:  No edema; No deformity  SKIN: Warm and dry NEUROLOGIC:  Alert and oriented x 3 PSYCHIATRIC:  Normal affect   ASSESSMENT:    1. Heart palpitations   2. Chest pain of uncertain etiology    PLAN:    In order of problems listed above:  Palpitations/chest pain.  Descriptions of palpitations concerning for arrhythmia.  Chest pain appears to be related to palpitations, always preceded by the episodes of palpitations.  Will check Zio patch x3 days to evaluate for arrhythmia.  Check TTE to rule out structural heart disease.  RTC in 3 months   Medication Adjustments/Labs and Tests Ordered: Current medicines are reviewed at length with the patient today.  Concerns regarding medicines are outlined above.  Orders Placed This Encounter  Procedures  . LONG TERM MONITOR (3-14 DAYS)  . EKG 12-Lead  . ECHOCARDIOGRAM COMPLETE   No orders of the defined types were placed in this encounter.   Patient Instructions  Medication Instructions:  Dr Gardiner Rhyme recommends that you continue on your current medications as directed. Please refer to the Current Medication list given to you today.  *If you need a refill on your cardiac medications before your next appointment, please call your pharmacy*  Testing/Procedures: 1. Your physician has requested that you have an echocardiogram. Echocardiography is a painless test that uses sound waves to create images of your heart. It provides your doctor with information about the size and shape of your heart and how well your heart's chambers and valves are  working. This procedure takes approximately one hour. There are no restrictions for this procedure.  >> This will be performed at our [facility] location [place], [place] [address] [phone]  2. Your physician has recommended that you wear a 3 DAY ZIO-PATCH monitor. The Zio patch cardiac monitor continuously records heart rhythm data for up to 14 days, this is for patients being evaluated for multiple types heart rhythms. For the first 24 hours post application, please avoid getting the Zio monitor wet in the shower or by excessive sweating during exercise. After that, feel free to carry on with regular activities.  Keep soaps and lotions away from the ZIO XT Patch. INSTRUCTIONS ARE LISTED BELOW      Follow-Up: At [facility], you and your health needs are our priority.  As part of our continuing mission to provide you with exceptional heart care, we have created designated Provider Care Teams.  These Care Teams include your primary Cardiologist (physician) and Advanced Practice Providers (APPs -  Physician Assistants and Nurse Practitioners) who all work together to provide you with the care you need, when you need it.  Your next appointment:   3 month(s)  The format for your next appointment:   Either In Person or Virtual  Provider:   You may see Little Ishikawahristopher L Jaun Galluzzo, MD or one of the following Advanced Practice Providers on your designated Care Team:    Theodore Demarkhonda Barrett, PA-C  Joni ReiningKathryn Lawrence, DNP, ANP  Cadence Fransico MichaelFurth, NP   Other Instructions Christena DeemZIO XT- Long Term Monitor Instructions   Your physician has requested you wear your ZIO patch monitor 3 days.   This is a single patch monitor.  [employer] supplies one patch monitor per enrollment.  Additional stickers are not available.   Please do not apply patch if you will be having a Nuclear Stress Test, Echocardiogram, Cardiac CT, MRI, or Chest Xray during the time frame you would be wearing the monitor. The  patch cannot be worn during these tests.  You cannot remove and re-apply the ZIO XT patch monitor.   Your ZIO patch monitor will be sent USPS Priority mail from [facility] directly to your home address. The monitor may also be mailed to a PO BOX if home delivery is not available.   It may take 3-5 days to receive your monitor after you have been enrolled.   Once you have received you monitor, please review enclosed instructions.  Your monitor has already been registered assigning a specific monitor serial # to you.   Applying the monitor   Shave hair from upper left chest.   Hold abrader disc by orange tab.  Rub abrader in 40 strokes over left upper chest as indicated in your monitor instructions.   Clean area with 4 enclosed alcohol pads .  Use all pads to assure are is cleaned thoroughly.  Let dry.   Apply patch as indicated in monitor instructions.  Patch will be place under collarbone on left side of chest with arrow pointing upward.   Rub patch adhesive wings for 2 minutes.Remove white label marked "1".  Remove white label marked "2".  Rub patch adhesive wings for 2 additional minutes.   While looking in a mirror, press and release button in center of patch.  A small green light will flash 3-4 times .  This will be your only indicator the monitor has been turned on.     Do not shower for the first 24 hours.  You may shower after the first 24 hours.   Press button if you feel a symptom. You will hear a small click.  Record Date, Time and Symptom in the Patient Log Book.   When you are ready to remove patch, follow instructions on last 2 pages of Patient Log Book.  Stick patch monitor onto last page of Patient Log Book.   Place Patient Log Book in [place] box.  Use locking tab on box and tape box closed securely.  The Orange and [employer] box has [employer]aid postage on it.  Please place in mailbox as soon as possible.  Your  physician should have your test results approximately  7 days after the monitor has been mailed back to [facility].   Call [facility] Customer Care at [phone] if you have questions regarding your ZIO XT patch monitor.  Call them immediately if you see an orange light blinking on your monitor.   If your monitor falls off in less than 4 days contact our Monitor department at [phone].  If your monitor becomes loose or falls off after 4 days call [employer] at [phone] for suggestions on securing your monitor.     Signed, Little Ishikawa, MD  04/08/2019 4:56 PM    [facility]

## 2019-04-07 NOTE — Telephone Encounter (Signed)
Patient called for refill for Adderall.Last visit 9/22/220 Next visit12/22/20. Please e-scribe to [facility] on [address]

## 2019-04-07 NOTE — Telephone Encounter (Signed)
RX for above e-scribed and sent to pharmacy on record  [id number] - [place], [place] - [address] [place] [address] Phone: [phone] Fax: [phone] 

## 2019-04-08 ENCOUNTER — Ambulatory Visit: Payer: BC Managed Care – PPO | Admitting: Cardiology

## 2019-04-08 ENCOUNTER — Encounter: Payer: Self-pay | Admitting: Cardiology

## 2019-04-08 VITALS — BP 134/80 | HR 79 | Ht 66.0 in | Wt 171.2 lb

## 2019-04-08 DIAGNOSIS — R079 Chest pain, unspecified: Secondary | ICD-10-CM

## 2019-04-08 DIAGNOSIS — R002 Palpitations: Secondary | ICD-10-CM

## 2019-04-08 NOTE — Patient Instructions (Addendum)
Medication Instructions:  Dr Schumann recommends that you continue on your current medications as directed. Please refer to the Current Medication list given to you today.  *If you need a refill on your cardiac medications before your next appointment, please call your pharmacy*  Testing/Procedures: 1.Your physician has requested that you have an echocardiogram. Echocardiography is a painless test that uses sound waves to create images of your heart. It provides your doctor with information about the size and shape of your heart and how well your heart's chambers and valves are working. This procedure takes approximately one hour. There are no restrictions for this procedure.  >> This will be performed at our [address] location [address], [address] [place] [address] [phone]  2. Your physician has recommended that you wear a 3 DAY ZIO-PATCH monitor. The Zio patch cardiac monitor continuously records heart rhythm data for up to 14 days, this is for patients being evaluated for multiple types heart rhythms. For the first 24 hours post application, please avoid getting the Zio monitor wet in the shower or by excessive sweating during exercise. After that, feel free to carry on with regular activities. Keep soaps and lotions away from the ZIO XT Patch. INSTRUCTIONS ARE LISTED BELOW    Follow-Up: At [facility], you and your health needs are our priority.  As part of our continuing mission to provide you with exceptional heart care, we have created designated Provider Care Teams.  These Care Teams include your primary Cardiologist (physician) and Advanced Practice Providers (APPs -  Physician Assistants and Nurse Practitioners) who all work together to provide you with the care you need, when you need it.  Your next appointment:   3 month(s)  The format for your next appointment:   Either In Person or Virtual  Provider:   You may see Christopher L Schumann, MD or one of the  following Advanced Practice Providers on your designated Care Team:    Rhonda Barrett, PA-C  Kathryn Lawrence, DNP, ANP  Cadence Furth, NP   Other Instructions ZIO XT- Long Term Monitor Instructions   Your physician has requested you wear your ZIO patch monitor 3 days.   This is a single patch monitor.  [employer] supplies one patch monitor per enrollment.  Additional stickers are not available.   Please do not apply patch if you will be having a Nuclear Stress Test, Echocardiogram, Cardiac CT, MRI, or Chest Xray during the time frame you would be wearing the monitor. The patch cannot be worn during these tests.  You cannot remove and re-apply the ZIO XT patch monitor.   Your ZIO patch monitor will be sent USPS Priority mail from [employer] directly to your home address. The monitor may also be mailed to a PO BOX if home delivery is not available.   It may take 3-5 days to receive your monitor after you have been enrolled.   Once you have received you monitor, please review enclosed instructions.  Your monitor has already been registered assigning a specific monitor serial # to you.   Applying the monitor   Shave hair from upper left chest.   Hold abrader disc by orange tab.  Rub abrader in 40 strokes over left upper chest as indicated in your monitor instructions.   Clean area with 4 enclosed alcohol pads .  Use all pads to assure are is cleaned thoroughly.  Let dry.   Apply patch as indicated in monitor instructions.  Patch will be   place under collarbone on left side of chest with arrow pointing upward.   Rub patch adhesive wings for 2 minutes.Remove white label marked "1".  Remove white label marked "2".  Rub patch adhesive wings for 2 additional minutes.   While looking in a mirror, press and release button in center of patch.  A small green light will flash 3-4 times .  This will be your only indicator the monitor has been turned on.     Do not shower for the first 24  hours.  You may shower after the first 24 hours.   Press button if you feel a symptom. You will hear a small click.  Record Date, Time and Symptom in the Patient Log Book.   When you are ready to remove patch, follow instructions on last 2 pages of Patient Log Book.  Stick patch monitor onto last page of Patient Log Book.   Place Patient Log Book in Orange and White box.  Use locking tab on box and tape box closed securely.  The Orange and White box has prepaid postage on it.  Please place in mailbox as soon as possible.  Your physician should have your test results approximately 7 days after the monitor has been mailed back to [employer].   Call [employer] Customer Care at [phone] if you have questions regarding your ZIO XT patch monitor.  Call them immediately if you see an orange light blinking on your monitor.   If your monitor falls off in less than 4 days contact our Monitor department at [phone].  If your monitor becomes loose or falls off after 4 days call [employer] at [phone] for suggestions on securing your monitor.  

## 2019-04-16 ENCOUNTER — Other Ambulatory Visit: Payer: Self-pay | Admitting: Pediatrics

## 2019-04-16 MED ORDER — VYVANSE 70 MG PO CAPS: 70.0000 mg | ORAL_CAPSULE | Freq: Every morning | ORAL | 0 refills | Status: DC

## 2019-04-16 MED ORDER — LISDEXAMFETAMINE DIMESYLATE 30 MG PO CAPS: 30.0000 mg | ORAL_CAPSULE | Freq: Every morning | ORAL | 0 refills | Status: DC

## 2019-04-16 NOTE — Telephone Encounter (Signed)
RX for above e-scribed and sent to pharmacy on record  [id number] - [place], [place] - [address] [place] [address] Phone: [phone] Fax: [phone] 

## 2019-04-20 ENCOUNTER — Ambulatory Visit (HOSPITAL_COMMUNITY): Payer: BC Managed Care – PPO | Attending: Cardiovascular Disease

## 2019-04-20 ENCOUNTER — Telehealth: Payer: Self-pay | Admitting: Radiology

## 2019-04-20 ENCOUNTER — Other Ambulatory Visit: Payer: Self-pay

## 2019-04-20 DIAGNOSIS — R002 Palpitations: Secondary | ICD-10-CM | POA: Diagnosis not present

## 2019-04-20 NOTE — Telephone Encounter (Signed)
Enrolled patient for a 3 day Zio to be mailed. 

## 2019-04-22 ENCOUNTER — Telehealth: Payer: Self-pay | Admitting: Cardiology

## 2019-04-22 NOTE — Telephone Encounter (Signed)
Spoke to  Patient. Result given -- patient verbalized understanding.  mychart administration code given

## 2019-04-22 NOTE — Telephone Encounter (Signed)
New Message  Pt is returning phone call regarding echo results  Please call to discuss

## 2019-05-06 ENCOUNTER — Other Ambulatory Visit (INDEPENDENT_AMBULATORY_CARE_PROVIDER_SITE_OTHER): Payer: Self-pay

## 2019-05-06 DIAGNOSIS — R002 Palpitations: Secondary | ICD-10-CM

## 2019-05-10 ENCOUNTER — Other Ambulatory Visit: Payer: Self-pay

## 2019-05-10 ENCOUNTER — Encounter: Payer: Self-pay | Admitting: Pediatrics

## 2019-05-10 ENCOUNTER — Telehealth: Payer: Self-pay | Admitting: Cardiology

## 2019-05-10 ENCOUNTER — Ambulatory Visit (INDEPENDENT_AMBULATORY_CARE_PROVIDER_SITE_OTHER): Payer: BC Managed Care – PPO | Admitting: Pediatrics

## 2019-05-10 DIAGNOSIS — Z7189 Other specified counseling: Secondary | ICD-10-CM

## 2019-05-10 DIAGNOSIS — R278 Other lack of coordination: Secondary | ICD-10-CM

## 2019-05-10 DIAGNOSIS — F411 Generalized anxiety disorder: Secondary | ICD-10-CM

## 2019-05-10 DIAGNOSIS — F902 Attention-deficit hyperactivity disorder, combined type: Secondary | ICD-10-CM

## 2019-05-10 DIAGNOSIS — Z719 Counseling, unspecified: Secondary | ICD-10-CM

## 2019-05-10 DIAGNOSIS — F422 Mixed obsessional thoughts and acts: Secondary | ICD-10-CM

## 2019-05-10 DIAGNOSIS — Z79899 Other long term (current) drug therapy: Secondary | ICD-10-CM

## 2019-05-10 MED ORDER — LISDEXAMFETAMINE DIMESYLATE 30 MG PO CAPS: 30.0000 mg | ORAL_CAPSULE | Freq: Every morning | ORAL | 0 refills | Status: DC

## 2019-05-10 MED ORDER — VYVANSE 70 MG PO CAPS: 70.0000 mg | ORAL_CAPSULE | Freq: Every morning | ORAL | 0 refills | Status: DC

## 2019-05-10 MED ORDER — AMPHETAMINE-DEXTROAMPHETAMINE 10 MG PO TABS: 10.0000 mg | ORAL_TABLET | ORAL | 0 refills | Status: DC | PRN

## 2019-05-10 NOTE — Patient Instructions (Signed)
DISCUSSION: Counseled regarding the following coordination of care items:  Continue medication as directed Vyvanse 70 mg and Vyvanse 30 mg every morning Adderall 10 mg every evening Prozac 20 mg every morning  RX for above e-scribed and sent to pharmacy on record  [id number] [place], [place] [address] [place] [address] Phone: [phone] Fax: [phone]  Counseled medication administration, effects, and possible side effects.  ADHD medications discussed to include different medications and pharmacologic properties of each. Recommendation for specific medication to include dose, administration, expected effects, possible side effects and the risk to benefit ratio of medication management.  Advised importance of:  Good sleep hygiene (8- 10 hours per night)  Limited screen time (none on school nights, no more than 2 hours on weekends)  Regular exercise(outside and active play)  Healthy eating (drink water, no sodas/sweet tea)   Counseling at this visit included the review of old records and/or current chart.   Counseling included the following discussion points presented at every visit to improve understanding and treatment compliance.  Recent health history and today's examination Growth and development with anticipatory guidance provided regarding brain growth, executive function maturation and pre or pubertal development. School progress and continued advocay for appropriate accommodations to include maintain Structure, routine, organization, reward, motivation and consequences.  Additionally the patient was counseled to take medication while driving.

## 2019-05-10 NOTE — Telephone Encounter (Signed)
3 day ZIO XT was ordered, but monitor will record for up to 14 days.  There is no additional charge for wearing monitor longer than 3 days. Patient received 2nd monitor because 1st monitor was not delivered in a timely basis.  Patient instructed to send second monitor back to [facility].  There is no charge for a monitor that has not been applied.

## 2019-05-10 NOTE — Telephone Encounter (Signed)
Patient is calling wanting to know if she can extend the amount of time she is to wear her monitor. She states her heart beat hasn't been irregular since she put it on and wants to wait until it does before removing it.

## 2019-05-10 NOTE — Progress Notes (Signed)
[facility] [address], [place]. [address] [place] [address] Dept: [phone] Dept Fax: [phone]  Medication Check by FaceTime due to COVID-19  Patient ID:  Susan Dyer  female DOB: Mar 24, 2001   18 y.o.   MRN: [id number]   DATE:05/10/19  PCP: [name], MD  Interviewed: Lilyannah Kubin  Location: Mother's home Provider location: Provider's private residence  Virtual Visit via Video Note Connected with [employer] on 05/10/19 at  2:00 PM EST by video enabled telemedicine application and verified that I am speaking with the correct person using two identifiers.     I discussed the limitations, risks, security and privacy concerns of performing an evaluation and management service by telephone and the availability of in person appointments. I also discussed with the parent/patient that there may be a patient responsible charge related to this service. The parent/patient expressed understanding and agreed to proceed.  HISTORY OF PRESENT ILLNESS/CURRENT STATUS: Susan Dyer is being followed for medication management for ADHD, dysgraphia and learning differences.   Last visit on 02/08/2019  [place] currently prescribed Vyvanse 70 mg and Vyvanse 30 mg and Prozac 20 mg and Adderall 10 mg every evening.    Behaviors: Mean and hateful off medication.  OCD is way better in [place], very stressed at home.  Will stay at home for holidays.  In contact with Father. Okay.  Eating well (eating breakfast, lunch and dinner).   Sleeping: bedtime 0100 pm awake by 1200 while at home.  Sleeping through the night.   EDUCATION: School: [employer] ended on the 6th  Goes back on Jan 14th. Had lived in the dorms.  Two roommates with shared bathroom, own room Back at home until Jan 14th - mostly with Lillia Abed in [place]. No improvement with relationship betwene mother and Lillia Abed (GF) Spanish (B), Biology with Lab  (A/B), Chemistry and Lab (C/A), History (B) Not sure of next semester chem.  Will take similar classes next semester.  Activities/ Exercise: daily  No real aerobic activity.  Not active. Gained weight. Is still working as Acupuncturist two weekends per month.  Screen time: (phone, tablet, TV, computer): non-essential, not excessive  MEDICAL HISTORY: Individual Medical History/ Review of Systems: Changes? :Yes  Cardiology - had palpitations and had normal EKG.  Palpations were random and now has not had any in a long time.  Will be wearing a zio patch.   Had breast swelling on left side, with left arm pain.  Had gynecologist check up with no etiology.  Then went to PCP recommended cardiology.  Had xray - WNL.  Had EKG - WNL.   Also had echo -   Family Medical/ Social History: Changes? No   Patient Lives with: mother  Current Medications:  Vyvanse 70 mg and Vyvanse 30 mg every morning Adderall 10 mg every evening Prozac 20 mg every morning  Medication Side Effects: None  MENTAL HEALTH: Mental Health Issues:    Denies sadness, loneliness or depression. No self harm or thoughts of self harm or injury. Denies fears, worries and anxieties. Has good peer relations and is not a bully nor is victimized. Coping stressed at home but doing well. Counseled regarding anxiety, OCD and hormonal influence on palpitations and breast tissue. May need to increase estrogen to 50 mcg short term for aide to PMDD. Will graph moods and anger/irritable to see if related to cycles. More picking at home, more irritable even at [facility] house.  DIAGNOSES:    ICD-10-CM   1. ADHD (attention deficit hyperactivity disorder), combined type  F90.2   2. Dysgraphia  R27.8   3. Generalized anxiety disorder  F41.1   4. Mixed obsessional thoughts and acts  F42.2   5. Medication management  Z79.899   6. Patient counseled  Z71.9   7. Counseling and coordination of care  Z71.89      RECOMMENDATIONS:  Patient  Instructions  DISCUSSION: Counseled regarding the following coordination of care items:  Continue medication as directed Vyvanse 70 mg and Vyvanse 30 mg every morning Adderall 10 mg every evening Prozac 20 mg every morning  RX for above e-scribed and sent to pharmacy on record  [id number] [place], [place] - [address] [place] [address] Phone: [phone] Fax: [phone]  Counseled medication administration, effects, and possible side effects.  ADHD medications discussed to include different medications and pharmacologic properties of each. Recommendation for specific medication to include dose, administration, expected effects, possible side effects and the risk to benefit ratio of medication management.  Advised importance of:  Good sleep hygiene (8- 10 hours per night)  Limited screen time (none on school nights, no more than 2 hours on weekends)  Regular exercise(outside and active play)  Healthy eating (drink water, no sodas/sweet tea)   Counseling at this visit included the review of old records and/or current chart.   Counseling included the following discussion points presented at every visit to improve understanding and treatment compliance.  Recent health history and today's examination Growth and development with anticipatory guidance provided regarding brain growth, executive function maturation and pre or pubertal development. School progress and continued advocay for appropriate accommodations to include maintain Structure, routine, organization, reward, motivation and consequences.  Additionally the patient was counseled to take medication while driving.          Discussed continued need for routine, structure, motivation, reward and positive reinforcement  Encouraged recommended limitations on TV, tablets, phones, video games and computers for non-educational activities.  Encouraged physical activity and  outdoor play, maintaining social distancing.  Discussed how to talk to anxious children about coronavirus.   Referred to [employer] for resources about engaging children who are at home in home and online study.    NEXT APPOINTMENT:  Return in about 3 months (around 08/08/2019) for Medication Check. Please call the office for a sooner appointment if problems arise.  Medical Decision-making: More than 50% of the appointment was spent counseling and discussing diagnosis and management of symptoms with the parent/patient.  I discussed the assessment and treatment plan with the parent. The parent/patient was provided an opportunity to ask questions and all were answered. The parent/patient agreed with the plan and demonstrated an understanding of the instructions.   The parent/patient was advised to call back or seek an in-person evaluation if the symptoms worsen or if the condition fails to improve as anticipated.  I provided 25 minutes of non-face-to-face time during this encounter.   Completed record review for 0 minutes prior to the virtual video visit.   Len Childs, NP  Counseling Time: 25 minutes   Total Contact Time: 25 minutes

## 2019-06-17 ENCOUNTER — Other Ambulatory Visit: Payer: Self-pay

## 2019-06-17 MED ORDER — LISDEXAMFETAMINE DIMESYLATE 30 MG PO CAPS: 30.0000 mg | ORAL_CAPSULE | Freq: Every morning | ORAL | 0 refills | Status: DC

## 2019-06-17 MED ORDER — VYVANSE 70 MG PO CAPS: 70.0000 mg | ORAL_CAPSULE | Freq: Every morning | ORAL | 0 refills | Status: DC

## 2019-06-17 NOTE — Telephone Encounter (Signed)
Vyvanse 70 mg and 30 mg each # 30 with RF's.RX for above e-scribed and sent to pharmacy on record  [phone] [place], [place] - [address] [place] [address] Phone: [phone] Fax: [phone]

## 2019-06-17 NOTE — Telephone Encounter (Signed)
Patient called in for Vyvanse 30mg  and 70mg . Last visit 05/10/2019 next visit 07/18/2019. [facility] [address]

## 2019-06-20 MED ORDER — VYVANSE 70 MG PO CAPS: 70.0000 mg | ORAL_CAPSULE | Freq: Every morning | ORAL | 0 refills | Status: DC

## 2019-06-20 MED ORDER — LISDEXAMFETAMINE DIMESYLATE 30 MG PO CAPS: 30.0000 mg | ORAL_CAPSULE | Freq: Every morning | ORAL | 0 refills | Status: DC

## 2019-06-20 NOTE — Addendum Note (Signed)
Addended by: Wonda Cheng A on: 06/20/2019 04:08 PM   Modules accepted: Orders

## 2019-06-20 NOTE — Telephone Encounter (Signed)
RX for above e-scribed and sent to pharmacy on record  [id number] [place], [place] - [address] [place] [address] Phone: [phone] Fax: [phone]

## 2019-06-20 NOTE — Telephone Encounter (Signed)
Patient called in stating that she would [facility] [place] [facility] [address]

## 2019-06-20 NOTE — Addendum Note (Signed)
Addended by: Burgess Estelle on: 06/20/2019 02:16 PM   Modules accepted: Orders

## 2019-07-01 ENCOUNTER — Other Ambulatory Visit: Payer: Self-pay

## 2019-07-01 MED ORDER — FLUOXETINE HCL 20 MG PO CAPS: 20.0000 mg | ORAL_CAPSULE | Freq: Every day | ORAL | 0 refills | Status: DC

## 2019-07-01 NOTE — Telephone Encounter (Signed)
E-Prescribed fluoxetine 20 directly to  [facility] [address] [place] - [address] [place] [address] Phone: [phone] Fax: [phone]

## 2019-07-01 NOTE — Telephone Encounter (Signed)
Patient called in for refill for Prozac. Last visit 05/10/2019 next visit  07/18/2019. Please escribe to [facility] in [place], [place]

## 2019-07-05 ENCOUNTER — Ambulatory Visit: Payer: BC Managed Care – PPO | Admitting: Cardiology

## 2019-07-05 ENCOUNTER — Other Ambulatory Visit: Payer: Self-pay

## 2019-07-05 ENCOUNTER — Encounter: Payer: Self-pay | Admitting: Cardiology

## 2019-07-05 ENCOUNTER — Encounter: Payer: Self-pay | Admitting: *Deleted

## 2019-07-05 VITALS — BP 130/89 | HR 102 | Ht 66.0 in | Wt 179.0 lb

## 2019-07-05 DIAGNOSIS — R079 Chest pain, unspecified: Secondary | ICD-10-CM | POA: Diagnosis not present

## 2019-07-05 DIAGNOSIS — R002 Palpitations: Secondary | ICD-10-CM

## 2019-07-05 NOTE — Patient Instructions (Signed)
Medication Instructions:  Your physician recommends that you continue on your current medications as directed. Please refer to the Current Medication list given to you today.  *If you need a refill on your cardiac medications before your next appointment, please call your pharmacy*  Lab Work: NONE  Testing/Procedures:  [place] Monitor Instructions   Your physician has requested you wear your ZIO patch monitor___7__days.   This is a single patch monitor.  [employer] supplies one patch monitor per enrollment.  Additional stickers are not available.   Please do not apply patch if you will be having a Nuclear Stress Test, Echocardiogram, Cardiac CT, MRI, or Chest Xray during the time frame you would be wearing the monitor. The patch cannot be worn during these tests.  You cannot remove and re-apply the ZIO XT patch monitor.   Your ZIO patch monitor will be sent USPS Priority mail from [facility] directly to your home address. The monitor may also be mailed to a PO BOX if home delivery is not available.   It may take 3-5 days to receive your monitor after you have been enrolled.   Once you have received you monitor, please review enclosed instructions.  Your monitor has already been registered assigning a specific monitor serial # to you.   Applying the monitor   Shave hair from upper left chest.   Hold abrader disc by orange tab.  Rub abrader in 40 strokes over left upper chest as indicated in your monitor instructions.   Clean area with 4 enclosed alcohol pads .  Use all pads to assure are is cleaned thoroughly.  Let dry.   Apply patch as indicated in monitor instructions.  Patch will be place under collarbone on left side of chest with arrow pointing upward.   Rub patch adhesive wings for 2 minutes.Remove white label marked "1".  Remove white label marked "2".  Rub patch adhesive wings for 2 additional minutes.   While looking in a mirror, press and release button in  center of patch.  A small green light will flash 3-4 times .  This will be your only indicator the monitor has been turned on.     Do not shower for the first 24 hours.  You may shower after the first 24 hours.   Press button if you feel a symptom. You will hear a small click.  Record Date, Time and Symptom in the Patient Log Book.   When you are ready to remove patch, follow instructions on last 2 pages of Patient Log Book.  Stick patch monitor onto last page of Patient Log Book.   Place Patient Log Book in [place] box.  Use locking tab on box and tape box closed securely.  The [employer] has [employer] on it.  Please place in mailbox as soon as possible.  Your physician should have your test results approximately 7 days after the monitor has been mailed back to [facility].   Call [facility] at [phone] if you have questions regarding your ZIO XT patch monitor.  Call them immediately if you see an orange light blinking on your monitor.   If your monitor falls off in less than 4 days contact our Monitor department at [phone].  If your monitor becomes loose or falls off after 4 days call [employer] at [phone] for suggestions on securing your monitor.    Follow-Up: At [facility], you and your health needs are our priority.  As part of our continuing mission to provide you with exceptional heart care, we have created designated Provider Care Teams.  These Care Teams include your primary Cardiologist (physician) and Advanced Practice Providers (APPs -  Physician Assistants and Nurse Practitioners) who all work together to provide you with the care you need, when you need it.  Your next appointment:   3 month(s)  The format for your next appointment:   Either In Person or Virtual  Provider:   Epifanio Lesches, MD

## 2019-07-05 NOTE — Progress Notes (Signed)
Cardiology Office Note:    Date:  07/05/2019   ID:  Susan Dyer, DOB 11/30/2000, MRN [id number]  PCP:  [name], MD  Cardiologist:  Little Ishikawa, MD  Electrophysiologist:  None   Referring MD: [name], MD   Chief Complaint  Patient presents with  . Palpitations    History of Present Illness:    Susan Dyer is a 19 y.o. female with a hx of ADHD who who presents for follow-up of palpitations.  She reports that she has been having episodes where her heart rate will get up to 180s to 190s at rest.  States that she has chest pain during these episodes.  She denies chest pain outside of these episodes, and reports that palpitations always proceeds the chest pain.  States that the episodes occur daily.  Pain in chest is left-sided.  She denies any chest pain or palpitations related to exertion, always occurs at rest.  She takes Adderall daily for her ADHD.  No smoking history.  No family history of heart disease.  TTE on 04/20/2019 showed no significant abnormalities.  Cardiac monitor on 06/13/2019 showed no significant arrhythmias, patient triggered events corresponded to sinus rhythm.  Patient states that while she wore her heart monitor, she had no symptoms of palpitations.  There were 16 triggered events on the monitor, but patient states she had none of her usual symptoms of palpitations.  States that while she wore the monitor she was on Christmas break from college, and that her symptoms are usually triggered by stress while at school.  States that since she has gone back to school for the spring semester she is having her usual palpitations again.  Occurring daily now, can last for up to 2 hours.   Past Medical History:  Diagnosis Date  . ADHD (attention deficit hyperactivity disorder), combined type 08/01/2015  . Dysgraphia 08/01/2015  . Generalized anxiety disorder 08/01/2015    No past surgical history on file.  Current Medications: Current Meds  Medication Sig    . amphetamine-dextroamphetamine (ADDERALL) 10 MG tablet Take 1 tablet (10 mg total) by mouth as needed (evening classes).  . clindamycin-benzoyl peroxide (BENZACLIN) gel clindamycin 1 %-benzoyl peroxide 5 % topical gel  . FLUoxetine (PROZAC) 20 MG capsule Take 1 capsule (20 mg total) by mouth daily.  Marland Kitchen [facility] 1/35 1-35 MG-MCG tablet Take 1 tablet by mouth daily.  Marland Kitchen lisdexamfetamine (VYVANSE) 30 MG capsule Take 1 capsule (30 mg total) by mouth every morning.  Marland Kitchen VYVANSE 70 MG capsule Take 1 capsule (70 mg total) by mouth every morning.     Allergies:   Patient has no known allergies.   Social History   Socioeconomic History  . Marital status: Single    Spouse name: Not on file  . Number of children: Not on file  . Years of education: Not on file  . Highest education level: Not on file  Occupational History  . Not on file  Tobacco Use  . Smoking status: Never Smoker  . Smokeless tobacco: Never Used  Substance and Sexual Activity  . Alcohol use: No    Alcohol/week: 0.0 standard drinks  . Drug use: No  . Sexual activity: Never  Other Topics Concern  . Not on file  Social History Narrative   Lives with mother and sister.  Biologic father uninvolved.  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   Social Determinants of Health   Financial Resource Strain:   . Difficulty of Paying Living Expenses: Not on file  Food  Insecurity:   . Worried About Programme researcher, broadcasting/film/video in the Last Year: Not on file  . Ran Out of Food in the Last Year: Not on file  Transportation Needs:   . Lack of Transportation (Medical): Not on file  . Lack of Transportation (Non-Medical): Not on file  Physical Activity:   . Days of Exercise per Week: Not on file  . Minutes of Exercise per Session: Not on file  Stress:   . Feeling of Stress : Not on file  Social Connections:   . Frequency of Communication with Friends and Family: Not on file  . Frequency of Social Gatherings with Friends and Family: Not on file  . Attends Religious  Services: Not on file  . Active Member of Clubs or Organizations: Not on file  . Attends Banker Meetings: Not on file  . Marital Status: Not on file     Family History: The patient's family history includes ADD / ADHD in her sister; Mental illness in her father.  ROS:   Please see the history of present illness.     All other systems reviewed and are negative.  EKGs/Labs/Other Studies Reviewed:    The following studies were reviewed today:   EKG:  EKG is  ordered today.  The ekg ordered today demonstrates normal sinus rhythm, rate 79, no ST/T abnormalities  Recent Labs: No results found for requested labs within last 8760 hours.  Recent Lipid Panel No results found for: CHOL, TRIG, HDL, CHOLHDL, VLDL, LDLCALC, LDLDIRECT   Cardiac monitor 06/12/18:  No significant arrhythmias detected   12 days of data recorded on Zio monitor. Patient had a min HR of 50 bpm, max HR of 193 bpm, and avg HR of 90 bpm. Predominant underlying rhythm was Sinus Rhythm. No VT, SVT, atrial fibrillation, high degree block, or pauses noted. Isolated atrial and ventricular ectopy was rare (<1%). There were 16 triggered events, which corresponded to sinus rhythm. No significant arrhythmias detected.  TTE 04/20/19:  1. Left ventricular ejection fraction, by visual estimation, is 60 to  65%. The left ventricle has normal function. There is no left ventricular  hypertrophy.  2. Global right ventricle has normal systolic function.The right  ventricular size is normal. No increase in right ventricular wall  thickness.  3. Left atrial size was normal.  4. Right atrial size was normal.  5. The mitral valve is normal in structure. No evidence of mitral valve  regurgitation. No evidence of mitral stenosis.  6. The tricuspid valve is normal in structure. Tricuspid valve  regurgitation is trivial.  7. The aortic valve is normal in structure. Aortic valve regurgitation is  not visualized. No  evidence of aortic valve sclerosis or stenosis.  8. The pulmonic valve was grossly normal. Pulmonic valve regurgitation is  not visualized.  9. Normal pulmonary artery systolic pressure.  10. The inferior vena cava is normal in size with greater than 50%  respiratory variability, suggesting right atrial pressure of 3 mmHg.   Physical Exam:    VS:  BP 130/89   Pulse (!) 102   Ht 5\' 6"  (1.676 m)   Wt 179 lb (81.2 kg)   SpO2 97%   BMI 28.89 kg/m     Wt Readings from Last 3 Encounters:  07/05/19 179 lb (81.2 kg)  04/08/19 171 lb 3.2 oz (77.7 kg) (92 %, Z= 1.40)*   * Growth percentiles are based on CDC (Girls, 2-20 Years) data.     GEN:  Well nourished, well developed in no acute distress HEENT: Normal NECK: No JVD; No carotid bruits LYMPHATICS: No lymphadenopathy CARDIAC: RRR, no murmurs, rubs, gallops RESPIRATORY:  Clear to auscultation without rales, wheezing or rhonchi  ABDOMEN: Soft, non-tender, non-distended MUSCULOSKELETAL:  No edema; No deformity  SKIN: Warm and dry NEUROLOGIC:  Alert and oriented x 3 PSYCHIATRIC:  Normal affect   ASSESSMENT:    1. Heart palpitations   2. Chest pain of uncertain etiology    PLAN:    In order of problems listed above:  Palpitations/chest pain.  Descriptions of palpitations concerning for arrhythmia.  Chest pain appears to be related to palpitations, always preceded by the episodes of palpitations.  TTE on 04/20/2019 showed no significant abnormalities.  Cardiac monitor on 06/13/2019 showed no significant arrhythmias, patient triggered events corresponded to sinus rhythm.  Patient reports that she did not have any palpitations while wearing monitor, as she wore it during her vacation from school and her palpitations are usually triggered by stress while at school.  Since she is now back at school, will repeat Zio patch x7 days.  RTC in 3 months  Medication Adjustments/Labs and Tests Ordered: Current medicines are reviewed at length  with the patient today.  Concerns regarding medicines are outlined above.  Orders Placed This Encounter  Procedures  . LONG TERM MONITOR (3-14 DAYS)   No orders of the defined types were placed in this encounter.   Patient Instructions  Medication Instructions:  Your physician recommends that you continue on your current medications as directed. Please refer to the Current Medication list given to you today.  *If you need a refill on your cardiac medications before your next appointment, please call your pharmacy*  Lab Work: NONE  Testing/Procedures:  [place] Monitor Instructions   Your physician has requested you wear your ZIO patch monitor___7__days.   This is a single patch monitor.  [employer] supplies one patch monitor per enrollment.  Additional stickers are not available.   Please do not apply patch if you will be having a Nuclear Stress Test, Echocardiogram, Cardiac CT, MRI, or Chest Xray during the time frame you would be wearing the monitor. The patch cannot be worn during these tests.  You cannot remove and re-apply the ZIO XT patch monitor.   Your ZIO patch monitor will be sent USPS Priority mail from [facility] directly to your home address. The monitor may also be mailed to a PO BOX if home delivery is not available.   It may take 3-5 days to receive your monitor after you have been enrolled.   Once you have received you monitor, please review enclosed instructions.  Your monitor has already been registered assigning a specific monitor serial # to you.   Applying the monitor   Shave hair from upper left chest.   Hold abrader disc by orange tab.  Rub abrader in 40 strokes over left upper chest as indicated in your monitor instructions.   Clean area with 4 enclosed alcohol pads .  Use all pads to assure are is cleaned thoroughly.  Let dry.   Apply patch as indicated in monitor instructions.  Patch will be place under collarbone on left side of chest  with arrow pointing upward.   Rub patch adhesive wings for 2 minutes.Remove white label marked "1".  Remove white label marked "2".  Rub patch adhesive wings for 2 additional minutes.   While looking in a mirror, press and release button in center of  patch.  A small green light will flash 3-4 times .  This will be your only indicator the monitor has been turned on.     Do not shower for the first 24 hours.  You may shower after the first 24 hours.   Press button if you feel a symptom. You will hear a small click.  Record Date, Time and Symptom in the Patient Log Book.   When you are ready to remove patch, follow instructions on last 2 pages of Patient Log Book.  Stick patch monitor onto last page of Patient Log Book.   Place Patient Log Book in [place] box.  Use locking tab on box and tape box closed securely.  The Orange and [employer] has [employer] on it.  Please place in mailbox as soon as possible.  Your physician should have your test results approximately 7 days after the monitor has been mailed back to [facility].   Call [facility] Customer Care at [phone] if you have questions regarding your ZIO XT patch monitor.  Call them immediately if you see an orange light blinking on your monitor.   If your monitor falls off in less than 4 days contact our Monitor department at [phone].  If your monitor becomes loose or falls off after 4 days call [employer] at [phone] for suggestions on securing your monitor.    Follow-Up: At [facility], you and your health needs are our priority.  As part of our continuing mission to provide you with exceptional heart care, we have created designated Provider Care Teams.  These Care Teams include your primary Cardiologist (physician) and Advanced Practice Providers (APPs -  Physician Assistants and Nurse Practitioners) who all work together to provide you with the care you need, when you need it.  Your next appointment:   3  month(s)  The format for your next appointment:   Either In Person or Virtual  Provider:   Epifanio Lesches, MD       Signed, Little Ishikawa, MD  07/05/2019 3:56 PM    [facility]

## 2019-07-05 NOTE — Progress Notes (Signed)
Patient ID: Susan Dyer, female   DOB: [birth year], 19 y.o.   MRN: [id number] Patient enrolled for [employer] to mail a 7 day ZIO XT long term holter monitor to her home.

## 2019-07-14 ENCOUNTER — Ambulatory Visit (INDEPENDENT_AMBULATORY_CARE_PROVIDER_SITE_OTHER): Payer: Self-pay

## 2019-07-14 DIAGNOSIS — R002 Palpitations: Secondary | ICD-10-CM

## 2019-07-18 ENCOUNTER — Other Ambulatory Visit: Payer: Self-pay

## 2019-07-18 ENCOUNTER — Encounter: Payer: Self-pay | Admitting: Pediatrics

## 2019-07-18 ENCOUNTER — Ambulatory Visit (INDEPENDENT_AMBULATORY_CARE_PROVIDER_SITE_OTHER): Payer: BC Managed Care – PPO | Admitting: Pediatrics

## 2019-07-18 VITALS — Ht 66.75 in | Wt 178.0 lb

## 2019-07-18 DIAGNOSIS — F902 Attention-deficit hyperactivity disorder, combined type: Secondary | ICD-10-CM

## 2019-07-18 DIAGNOSIS — R278 Other lack of coordination: Secondary | ICD-10-CM

## 2019-07-18 DIAGNOSIS — Z7189 Other specified counseling: Secondary | ICD-10-CM

## 2019-07-18 DIAGNOSIS — F411 Generalized anxiety disorder: Secondary | ICD-10-CM | POA: Diagnosis not present

## 2019-07-18 DIAGNOSIS — Z719 Counseling, unspecified: Secondary | ICD-10-CM

## 2019-07-18 DIAGNOSIS — F422 Mixed obsessional thoughts and acts: Secondary | ICD-10-CM

## 2019-07-18 DIAGNOSIS — Z79899 Other long term (current) drug therapy: Secondary | ICD-10-CM

## 2019-07-18 MED ORDER — LISDEXAMFETAMINE DIMESYLATE 30 MG PO CAPS: 30.0000 mg | ORAL_CAPSULE | Freq: Every morning | ORAL | 0 refills | Status: DC

## 2019-07-18 MED ORDER — VYVANSE 70 MG PO CAPS: 70.0000 mg | ORAL_CAPSULE | Freq: Every morning | ORAL | 0 refills | Status: DC

## 2019-07-18 NOTE — Progress Notes (Signed)
Medication Check  Patient ID: Susan Dyer  DOB: [birth year]  MRN: [id number]  DATE:07/18/19 Susan Salmon, MD  Accompanied by: Self Patient Lives with: mother in frequently, improved drama. Susan Dyer - significant other - still together, some drama but okay, Thursday to Sunday in [place] Dorm at school - [place], [place], [place] - good status, no drama  HISTORY/CURRENT STATUS: Chief Complaint - Polite and cooperative and present for medical follow up for medication management of ADHD, dysgraphia and learning differences.  Last follow up 05/10/2019.  Doing well in school, improving relationships.  Problems now with recent falling asleep, staying asleep.  Feels heart race, burps with reflux and chest pressure.  Recently started to dip again. Had some trials in HS, now more regularly for school work and to say focused. EDUCATION: School: [employer] Year/Grade: Sophomore - second semester Mostly on line Biology, Genetics, Bio/Gen Lab,  Spanish, History, Math Mostly B grades, feels doing well. In person Monday-Wednesday every other week lab or optional Spanish Hard to deal with 3 hour labs.  Activities/ Exercise: daily  Works at [employer], monthly. [employer] started back on Wednesday  Screen time: (phone, tablet, TV, computer): not excessive  MEDICAL HISTORY: Appetite: WNL   Sleep: Bedtime: more insomnia back to school for one month    Had late to bed not falling until 0400 and then has 0900 classes, took nap from 10 to 12 Concerns: Initiation/Maintenance/Other: Asleep easily, sleeps through the night, feels well-rested.  No Sleep concerns.  Individual Medical History/ Review of Systems: Changes? :Yes has second zio pack due to first pack did not pick up.  Symptoms now - heart racing, acid reflux and some chest pain currently.  Today upon awakening, and again prior to coming up. Has pulse ox on in office with HR changes 98 - 123, averages 116-118  Family Medical/ Social History: Changes?  No  Current Medications:  Vyvanse 70 mg and Vyvanse 30 mg every morning Prozac 20 mg every morning Medication Side Effects: None  MENTAL HEALTH: Mental Health Issues:  Has anger issues, some anxiety with insomnia Counseled sleep, diet, Review of Systems  Constitutional: Negative for fatigue.  HENT: Negative.   Eyes: Negative.   Respiratory: Negative.   Cardiovascular:       Bouts of tachycardia and reflux  Gastrointestinal: Negative.   Endocrine: Negative.   Genitourinary: Negative.   Musculoskeletal: Negative.   Skin: Negative.   Allergic/Immunologic: Negative.   Neurological: Negative for seizures and headaches.  Hematological: Negative.   Psychiatric/Behavioral: Positive for sleep disturbance. Negative for agitation, behavioral problems and decreased concentration. The patient is nervous/anxious. The patient is not hyperactive.   All other systems reviewed and are negative.   PHYSICAL EXAM; Vitals:   07/18/19 1404  Weight: 178 lb (80.7 kg)  Height: 5' 6.75" (1.695 m)   Body mass index is 28.09 kg/m.  General Physical Exam: Unchanged from previous exam, date:04/2019    DIAGNOSES:    ICD-10-CM   1. ADHD (attention deficit hyperactivity disorder), combined type  F90.2   2. Dysgraphia  R27.8   3. Generalized anxiety disorder  F41.1   4. Mixed obsessional thoughts and acts  F42.2   5. Medication management  Z79.899   6. Patient counseled  Z71.9   7. Parenting dynamics counseling  Z71.89   8. Counseling and coordination of care  Z71.89     RECOMMENDATIONS:  Patient Instructions  DISCUSSION: Counseled regarding the following coordination of care items:  Continue medication as directed Vyvanse 70 mg every morning Vyvanse  30 mg every morning Prozac 20 mg every morning  RX for Vyvanse sent today  RX for above e-scribed and sent to pharmacy on record  [id number] [place], [place] [address] [place]  [address] Phone: [phone] Fax: [phone]  Counseled medication administration, effects, and possible side effects.  ADHD medications discussed to include different medications and pharmacologic properties of each. Recommendation for specific medication to include dose, administration, expected effects, possible side effects and the risk to benefit ratio of medication management.  Advised importance of:  Good sleep hygiene (8- 10 hours per night)  Limited screen time (none on school nights, no more than 2 hours on weekends)  Regular exercise(outside and active play)  Healthy eating (drink water, no sodas/sweet tea)  Regular family meals have been linked to lower levels of adolescent risk-taking behavior.  Adolescents who frequently eat meals with their family are less likely to engage in risk behaviors than those who never or rarely eat with their families.  So it is never too early to start this tradition.  Tobacco cessation   1.  Almost all adult tobacco users start when they are teens. 2.  While tobacco use rates have been declining among youth over the past 20 years, almost a quarter of teens still use tobacco. 3. Tobacco-using teens are more likely to use other substances, become involved in violent behavior, be sexually active, and are at a higher risk for depression and suicide. Local Resources: [place] Quitline https://www.young.biz/ 1-800-QUIT-NOW  [facility] Tobacco Prevention and Control Mary Gillett mgillet@co .[place].us 944-967-5916   Online Resources: American Cancer Society  http://www.cancer.[place]  http://hunter.com/  Centers for Disease Control and Prevention RegulatorBlog.com.cy   Please get into counseling through [facility].         NEXT APPOINTMENT:  Return in about 3 months (around 10/18/2019) for Medication Check.  Medical Decision-making: More  than 50% of the appointment was spent counseling and discussing diagnosis and management of symptoms with the patient and family.  Counseling Time: 25 minutes Total Contact Time: 30 minutes

## 2019-07-18 NOTE — Patient Instructions (Signed)
DISCUSSION: Counseled regarding the following coordination of care items:  Continue medication as directed Vyvanse 70 mg every morning Vyvanse 30 mg every morning Prozac 20 mg every morning  RX for Vyvanse sent today  RX for above e-scribed and sent to pharmacy on record  [id number] [place], [place] - [address] [place] [address] Phone: [phone] Fax: [phone]  Counseled medication administration, effects, and possible side effects.  ADHD medications discussed to include different medications and pharmacologic properties of each. Recommendation for specific medication to include dose, administration, expected effects, possible side effects and the risk to benefit ratio of medication management.  Advised importance of:  Good sleep hygiene (8- 10 hours per night)  Limited screen time (none on school nights, no more than 2 hours on weekends)  Regular exercise(outside and active play)  Healthy eating (drink water, no sodas/sweet tea)  Regular family meals have been linked to lower levels of adolescent risk-taking behavior.  Adolescents who frequently eat meals with their family are less likely to engage in risk behaviors than those who never or rarely eat with their families.  So it is never too early to start this tradition.  Tobacco cessation   1.  Almost all adult tobacco users start when they are teens. 2.  While tobacco use rates have been declining among youth over the past 20 years, almost a quarter of teens still use tobacco. 3. Tobacco-using teens are more likely to use other substances, become involved in violent behavior, be sexually active, and are at a higher risk for depression and suicide. Local Resources: [place] Quitline SpiritualAlarm.tn 1-800-QUIT-NOW  [facility] Tobacco Prevention and Control Mary Gillett mgillet@co .[place]..us 283-151-7616   Online Resources: American Cancer Society  http://www.cancer.org  Teens Health  EarlyMetal.com.br  Centers for Disease Control and Prevention ParkingAffiliateProgram.tn   Please get into counseling through [facility].

## 2019-08-19 ENCOUNTER — Other Ambulatory Visit: Payer: Self-pay | Admitting: Pediatrics

## 2019-08-19 MED ORDER — LISDEXAMFETAMINE DIMESYLATE 30 MG PO CAPS: 30.0000 mg | ORAL_CAPSULE | Freq: Every morning | ORAL | 0 refills | Status: DC

## 2019-08-19 MED ORDER — VYVANSE 70 MG PO CAPS: 70.0000 mg | ORAL_CAPSULE | Freq: Every morning | ORAL | 0 refills | Status: DC

## 2019-08-19 NOTE — Telephone Encounter (Signed)
Patient called for refill for Vyvanse 70 mg and Vyvanse 20 mg.  Patient last seen 07/18/19, next appointment 10/18/19.  [facility] [address]

## 2019-08-19 NOTE — Telephone Encounter (Signed)
RX for above e-scribed and sent to pharmacy on record  [id number] - [place], [place] - [address] [name] [address] Phone: [phone] Fax: [phone] 

## 2019-09-21 ENCOUNTER — Other Ambulatory Visit: Payer: Self-pay | Admitting: Pediatrics

## 2019-09-21 MED ORDER — LISDEXAMFETAMINE DIMESYLATE 30 MG PO CAPS: 30.0000 mg | ORAL_CAPSULE | Freq: Every morning | ORAL | 0 refills | Status: DC

## 2019-09-21 MED ORDER — VYVANSE 70 MG PO CAPS: 70.0000 mg | ORAL_CAPSULE | Freq: Every morning | ORAL | 0 refills | Status: DC

## 2019-09-21 NOTE — Telephone Encounter (Signed)
Patient called for refills for Vyvanse 70 mg and Vyvanse 30 mg.  Patient last seen 07/18/19, next appointment 10/18/19.  [facility] [address]

## 2019-09-21 NOTE — Telephone Encounter (Signed)
E-Prescribed Vyvanse 70 and Vyvanse 30 directly to  [id number] - [place], [place] - [address] [place] [address] Phone: [phone] Fax: [phone]  

## 2019-10-03 ENCOUNTER — Other Ambulatory Visit: Payer: Self-pay

## 2019-10-03 ENCOUNTER — Encounter: Payer: Self-pay | Admitting: Cardiology

## 2019-10-03 ENCOUNTER — Telehealth (INDEPENDENT_AMBULATORY_CARE_PROVIDER_SITE_OTHER): Payer: BC Managed Care – PPO | Admitting: Cardiology

## 2019-10-03 VITALS — Ht 67.0 in | Wt 170.0 lb

## 2019-10-03 DIAGNOSIS — R079 Chest pain, unspecified: Secondary | ICD-10-CM | POA: Diagnosis not present

## 2019-10-03 DIAGNOSIS — R002 Palpitations: Secondary | ICD-10-CM

## 2019-10-03 MED ORDER — FLUOXETINE HCL 20 MG PO CAPS: 20.0000 mg | ORAL_CAPSULE | Freq: Every day | ORAL | 0 refills | Status: DC

## 2019-10-03 NOTE — Telephone Encounter (Signed)
RX for above e-scribed and sent to pharmacy on record  [id number] - [place] - [address] [place] [address] Phone: [phone] Fax: [phone]   

## 2019-10-03 NOTE — Telephone Encounter (Signed)
Patient called in for refill for Prozac. Last visit 07/18/2019 next visit 10/18/2019. Please escribe to [facility] in [place], [place]

## 2019-10-03 NOTE — Progress Notes (Signed)
Virtual Visit via Telephone Note   This visit type was conducted due to national recommendations for restrictions regarding the COVID-19 Pandemic (e.g. social distancing) in an effort to limit this patient's exposure and mitigate transmission in our community.  Due to her co-morbid illnesses, this patient is at least at moderate risk for complications without adequate follow up.  This format is felt to be most appropriate for this patient at this time.  The patient did not have access to video technology/had technical difficulties with video requiring transitioning to audio format only (telephone).  All issues noted in this document were discussed and addressed.  No physical exam could be performed with this format.  Please refer to the patient's chart for her  consent to telehealth for [facility].   Date:  10/03/2019   ID:  Susan Dyer, DOB 2001-01-19, MRN [id number]  Patient Location: Home Provider Location: Office  PCP:  [name], MD  Cardiologist:  Little Ishikawa, MD  Electrophysiologist:  None   Evaluation Performed:  Follow-Up Visit  Chief Complaint: Palpitations  History of Present Illness:    Susan Dyer is a 19 y.o. female with a hx of ADHD who who presents for follow-up of palpitations.  She reports that she has been having episodes where her heart rate will get up to 180s to 190s at rest.  States that she has chest pain during these episodes.  She denies chest pain outside of these episodes, and reports that palpitations always proceeds the chest pain.  States that the episodes occur daily.  Pain in chest is left-sided.  She denies any chest pain or palpitations related to exertion, always occurs at rest.  She takes Adderall daily for her ADHD.  No smoking history.  No family history of heart disease.  TTE on 04/20/2019 showed no significant abnormalities.  Cardiac monitor on 06/13/2019 showed no significant arrhythmias, patient triggered events corresponded to  sinus rhythm.  There were 16 triggered events in the initial monitor but patient stated she had none of her usual symptoms of palpitations while wearing.  Second cardiac monitor 3/18/21showed no arrhythmias, triggered events corresponded to sinus rhythm.  Since last clinic visit, patient reports that she continues to have palpitations.  Occurs 3-4 times per day.  Feels like her heart is racing.   Past Medical History:  Diagnosis Date  . ADHD (attention deficit hyperactivity disorder), combined type 08/01/2015  . Dysgraphia 08/01/2015  . Generalized anxiety disorder 08/01/2015   No past surgical history on file.   Current Meds  Medication Sig  . amphetamine-dextroamphetamine (ADDERALL) 10 MG tablet Take 10 mg by mouth daily as needed.  Marland Kitchen FLUoxetine (PROZAC) 20 MG capsule Take 1 capsule (20 mg total) by mouth daily.  Marland Kitchen [facility] 1/35 1-35 MG-MCG tablet Take 1 tablet by mouth daily.  Marland Kitchen lisdexamfetamine (VYVANSE) 30 MG capsule Take 1 capsule (30 mg total) by mouth every morning.  Marland Kitchen VYVANSE 70 MG capsule Take 1 capsule (70 mg total) by mouth every morning.     Allergies:   Patient has no known allergies.   Social History   Tobacco Use  . Smoking status: Never Smoker  . Smokeless tobacco: Current User    Types: Chew  . Tobacco comment: dips four or five times per day for school   Substance Use Topics  . Alcohol use: No    Alcohol/week: 0.0 standard drinks  . Drug use: No     Family Hx: The patient's family history includes ADD /  ADHD in her sister; Mental illness in her father.  ROS:   Please see the history of present illness.     All other systems reviewed and are negative.   Prior CV studies:   The following studies were reviewed today:    Labs/Other Tests and Data Reviewed:    EKG:  No ECG reviewed.  Recent Labs: No results found for requested labs within last 8760 hours.   Recent Lipid Panel No results found for: CHOL, TRIG, HDL, CHOLHDL, LDLCALC, LDLDIRECT  Wt  Readings from Last 3 Encounters:  10/03/19 170 lb (77.1 kg)  07/05/19 179 lb (81.2 kg)  04/08/19 171 lb 3.2 oz (77.7 kg) (92 %, Z= 1.40)*   * Growth percentiles are based on CDC (Girls, 2-20 Years) data.     Objective:    Vital Signs:  Ht 5\' 7"  (1.702 m)   Wt 170 lb (77.1 kg)   BMI 26.63 kg/m    VITAL SIGNS:  reviewed  Gen: No respiratory distress, able to converse comfortably  ASSESSMENT & PLAN:    Palpitations/chest pain.  Descriptions of palpitations concerning for arrhythmia.  Chest pain appears to be related to palpitations, always preceded by the episodes of palpitations.  TTE on 04/20/2019 showed no significant abnormalities.  Cardiac monitor on 06/13/2019 showed no significant arrhythmias, patient triggered events corresponded to sinus rhythm.  Patient reports that she did not have any palpitations while wearing monitor, as she wore it during her vacation from school and her palpitations are usually triggered by stress while at school.    Second cardiac monitor 3/18/21showed no arrhythmias, triggered events corresponded to sinus rhythm. -No evidence of cardiac arrhythmia as etiology of her symptoms and structurally normal heart on echocardiogram.  No further cardiac work-up recommended.  RTC as needed    Time:   Today, I have spent 7 minutes with the patient with telehealth technology discussing the above problems.     Medication Adjustments/Labs and Tests Ordered: Current medicines are reviewed at length with the patient today.  Concerns regarding medicines are outlined above.   Tests Ordered: No orders of the defined types were placed in this encounter.   Medication Changes: No orders of the defined types were placed in this encounter.   Follow Up:  As needed  Signed, Donato Heinz, MD  10/03/2019 4:33 PM    [place]

## 2019-10-03 NOTE — Patient Instructions (Signed)
Medication Instructions:  Your physician recommends that you continue on your current medications as directed. Please refer to the Current Medication list given to you today.  Follow-Up: At [facility], you and your health needs are our priority.  As part of our continuing mission to provide you with exceptional heart care, we have created designated Provider Care Teams.  These Care Teams include your primary Cardiologist (physician) and Advanced Practice Providers (APPs -  Physician Assistants and Nurse Practitioners) who all work together to provide you with the care you need, when you need it.  We recommend signing up for the patient portal called "MyChart".  Sign up information is provided on this After Visit Summary.  MyChart is used to connect with patients for Virtual Visits (Telemedicine).  Patients are able to view lab/test results, encounter notes, upcoming appointments, etc.  Non-urgent messages can be sent to your provider as well.   To learn more about what you can do with MyChart, go to [email or web address].    Your next appointment:    as needed with Dr. Bjorn Pippin

## 2019-10-18 ENCOUNTER — Other Ambulatory Visit: Payer: Self-pay

## 2019-10-18 ENCOUNTER — Encounter: Payer: Self-pay | Admitting: Pediatrics

## 2019-10-18 ENCOUNTER — Telehealth (INDEPENDENT_AMBULATORY_CARE_PROVIDER_SITE_OTHER): Payer: BC Managed Care – PPO | Admitting: Pediatrics

## 2019-10-18 DIAGNOSIS — Z719 Counseling, unspecified: Secondary | ICD-10-CM

## 2019-10-18 DIAGNOSIS — F422 Mixed obsessional thoughts and acts: Secondary | ICD-10-CM

## 2019-10-18 DIAGNOSIS — F902 Attention-deficit hyperactivity disorder, combined type: Secondary | ICD-10-CM | POA: Diagnosis not present

## 2019-10-18 DIAGNOSIS — R278 Other lack of coordination: Secondary | ICD-10-CM

## 2019-10-18 DIAGNOSIS — F411 Generalized anxiety disorder: Secondary | ICD-10-CM | POA: Diagnosis not present

## 2019-10-18 DIAGNOSIS — Z79899 Other long term (current) drug therapy: Secondary | ICD-10-CM

## 2019-10-18 DIAGNOSIS — Z7189 Other specified counseling: Secondary | ICD-10-CM

## 2019-10-18 MED ORDER — VYVANSE 70 MG PO CAPS: 70.0000 mg | ORAL_CAPSULE | Freq: Every morning | ORAL | 0 refills | Status: DC

## 2019-10-18 MED ORDER — LISDEXAMFETAMINE DIMESYLATE 30 MG PO CAPS: 30.0000 mg | ORAL_CAPSULE | Freq: Every morning | ORAL | 0 refills | Status: DC

## 2019-10-18 NOTE — Patient Instructions (Addendum)
DISCUSSION: Counseled regarding the following coordination of care items:  Continue medication as directed Vyvanse 70 mg and Vyvanse 30 mg daily Prozac 20 mg daily RX for above e-scribed and sent to pharmacy on record  [id number] [place], [place] - [address] [place] [address] Phone: [phone] Fax: [phone]  Counseled regarding obtaining refills by calling pharmacy first to use automated refill request then if needed, call our office leaving a detailed message on the refill line.  Counseled medication administration, effects, and possible side effects.  ADHD medications discussed to include different medications and pharmacologic properties of each. Recommendation for specific medication to include dose, administration, expected effects, possible side effects and the risk to benefit ratio of medication management.  Advised importance of:  Good sleep hygiene (8- 10 hours per night)  Limited screen time (none on school nights, no more than 2 hours on weekends)  Regular exercise(outside and active play)  Healthy eating (drink water, no sodas/sweet tea)  Regular family meals have been linked to lower levels of adolescent risk-taking behavior.  Adolescents who frequently eat meals with their family are less likely to engage in risk behaviors than those who never or rarely eat with their families.  So it is never too early to start this tradition.  Counseling at this visit included the review of old records and/or current chart.   Counseling included the following discussion points presented at every visit to improve understanding and treatment compliance.  Recent health history and today's examination Growth and development with anticipatory guidance provided regarding brain growth, executive function maturation and pre or pubertal development. School progress and continued advocay for appropriate accommodations to include maintain  Structure, routine, organization, reward, motivation and consequences.  Additionally the patient was counseled to take medication while driving.

## 2019-10-18 NOTE — Progress Notes (Signed)
[facility]. [address] [place] [address] Dept: [phone] Dept Fax: [phone]  Medication Check by Caregility due to COVID-19  Patient ID:  Susan Dyer  female DOB: 04-21-2001   19 y.o.   MRN: [id number]   DATE:10/18/19  PCP: Harrie Jeans, MD  Interviewed: Susan Dyer  Location: Mother's home Currently.  Also stays with GF in [place]. Provider location: [facility] office  Virtual Visit via Video Note Connected with [name] on 10/18/19 at  2:00 PM EDT by video enabled telemedicine application and verified that I am speaking with the correct person using two identifiers.     I discussed the limitations, risks, security and privacy concerns of performing an evaluation and management service by telephone and the availability of in person appointments. I also discussed with the parent/patient that there may be a patient responsible charge related to this service. The parent/patient expressed understanding and agreed to proceed.  HISTORY OF PRESENT ILLNESS/CURRENT STATUS: Susan Dyer is being followed for medication management for ADHD, dysgraphia and learning differences and Anxiety.   Last visit on 07/18/2019  Susan Dyer currently prescribed Vyvanse 70 mg and Vyvanse 30 mg and Prozac 20 mg daily.  Not usually using Adderall 5 mg unless driving later in the day.    Sleeping: bedtime 0100 pm awake by 0800-1000 as late as 1200. Sleeping through the night.   EDUCATION: School: [employer]   Classes over for one month Mother wants her to work, but has been sick for the past two weeks.  Would be working at [employer] (usually works on weekends).  Activities/ Exercise: daily  Screen time: (phone, tablet, TV, computer): non-essential, not excessive  MEDICAL HISTORY: Individual Medical History/ Review of Systems: Changes? :Yes  Feels feverish and headache for two weeks, got tested for Covid was  negative.  Family Medical/ Social History: Changes? No   Patient Lives with: mother  MENTAL HEALTH: Mental Health Issues:    Denies sadness, loneliness or depression. No self harm or thoughts of self harm or injury. Denies fears, worries and anxieties. Has good peer relations and is not a bully nor is victimized.  DIAGNOSES:    ICD-10-CM   1. ADHD (attention deficit hyperactivity disorder), combined type  F90.2   2. Dysgraphia  R27.8   3. Generalized anxiety disorder  F41.1   4. Mixed obsessional thoughts and acts  F42.2   5. Medication management  Z79.899   6. Patient counseled  Z71.9   7. Parenting dynamics counseling  Z71.89   8. Counseling and coordination of care  Z71.89     RECOMMENDATIONS:  Patient Instructions  DISCUSSION: Counseled regarding the following coordination of care items:  Continue medication as directed Vyvanse 70 mg and Vyvanse 30 mg daily Prozac 20 mg daily RX for above e-scribed and sent to pharmacy on record  [id number] [place] [place] [address] [place] [address] Phone: [phone] Fax: [phone]  Counseled regarding obtaining refills by calling pharmacy first to use automated refill request then if needed, call our office leaving a detailed message on the refill line.  Counseled medication administration, effects, and possible side effects.  ADHD medications discussed to include different medications and pharmacologic properties of each. Recommendation for specific medication to include dose, administration, expected effects, possible side effects and the risk to benefit ratio of medication management.  Advised importance of:  Good sleep hygiene (8- 10 hours per night)  Limited screen time (none on school nights, no more than 2 hours on weekends)  Regular exercise(outside and active play)  Healthy eating (drink water, no sodas/sweet tea)  Regular family meals have been linked to lower  levels of adolescent risk-taking behavior.  Adolescents who frequently eat meals with their family are less likely to engage in risk behaviors than those who never or rarely eat with their families.  So it is never too early to start this tradition.  Counseling at this visit included the review of old records and/or current chart.   Counseling included the following discussion points presented at every visit to improve understanding and treatment compliance.  Recent health history and today's examination Growth and development with anticipatory guidance provided regarding brain growth, executive function maturation and pre or pubertal development. School progress and continued advocay for appropriate accommodations to include maintain Structure, routine, organization, reward, motivation and consequences.  Additionally the patient was counseled to take medication while driving.      Discussed continued need for routine, structure, motivation, reward and positive reinforcement  Encouraged recommended limitations on TV, tablets, phones, video games and computers for non-educational activities.  Encouraged physical activity and outdoor play, maintaining social distancing.   Referred to [email or web address] for resources about ADHD, engaging children who are at home in home and online study.    NEXT APPOINTMENT:  Return in about 6 months (around 04/18/2020) for Medical Follow up. Please call the office for a sooner appointment if problems arise.  Medical Decision-making: More than 50% of the appointment was spent counseling and discussing diagnosis and management of symptoms with the parent/patient.  I discussed the assessment and treatment plan with the parent. The parent/patient was provided an opportunity to ask questions and all were answered. The parent/patient agreed with the plan and demonstrated an understanding of the instructions.   The parent/patient was advised to call back or seek an  in-person evaluation if the symptoms worsen or if the condition fails to improve as anticipated.  I provided 25 minutes of non-face-to-face time during this encounter.   Completed record review for 0 minutes prior to the virtual video visit.   [name] Penna, NP  Counseling Time: 25 minutes   Total Contact Time: 25 minutes

## 2019-11-22 ENCOUNTER — Other Ambulatory Visit: Payer: Self-pay

## 2019-11-22 MED ORDER — VYVANSE 70 MG PO CAPS: 70.0000 mg | ORAL_CAPSULE | Freq: Every morning | ORAL | 0 refills | Status: DC

## 2019-11-22 MED ORDER — LISDEXAMFETAMINE DIMESYLATE 30 MG PO CAPS: 30.0000 mg | ORAL_CAPSULE | Freq: Every morning | ORAL | 0 refills | Status: DC

## 2019-11-22 NOTE — Telephone Encounter (Signed)
Patient called in for refill for Vyvanse. Last visit 10/18/2019 next visit 02/17/2020. [facility] [address]

## 2019-11-22 NOTE — Telephone Encounter (Signed)
Vyvanse 70 mg and 30 mg each Rx daily, # 30 with no RF's.RX for above e-scribed and sent to pharmacy on record  [phone] [place], [place] - [address] [place] [address] Phone: [phone] Fax: [phone]

## 2019-12-21 ENCOUNTER — Other Ambulatory Visit: Payer: Self-pay

## 2019-12-21 MED ORDER — LISDEXAMFETAMINE DIMESYLATE 30 MG PO CAPS: 30.0000 mg | ORAL_CAPSULE | Freq: Every morning | ORAL | 0 refills | Status: DC

## 2019-12-21 MED ORDER — VYVANSE 70 MG PO CAPS: 70.0000 mg | ORAL_CAPSULE | Freq: Every morning | ORAL | 0 refills | Status: DC

## 2019-12-21 NOTE — Telephone Encounter (Signed)
Patient called in for refill for Vyvanse. Last visit 10/18/2019 next visit 02/17/2020. [facility] [place] [place]

## 2019-12-21 NOTE — Telephone Encounter (Signed)
E-Prescribed Vyvanse 70 and Vyvanse 30 directly to  [id number] [place], [place] - [address] [place] [address] Phone: [phone] Fax: [phone]

## 2019-12-30 ENCOUNTER — Other Ambulatory Visit: Payer: Self-pay

## 2019-12-30 MED ORDER — FLUOXETINE HCL 20 MG PO CAPS: 20.0000 mg | ORAL_CAPSULE | Freq: Every day | ORAL | 0 refills | Status: DC

## 2019-12-30 NOTE — Telephone Encounter (Signed)
RX for above e-scribed and sent to pharmacy on record  [id number] - [place], [place] - [address] [place] [address] Phone: [phone] Fax: [phone]   

## 2019-12-30 NOTE — Telephone Encounter (Signed)
Patient called in for refill forProzac. Last visit6/05/2019 next visit10/05/2019[facility] [address]

## 2020-01-20 ENCOUNTER — Other Ambulatory Visit: Payer: Self-pay

## 2020-01-20 MED ORDER — LISDEXAMFETAMINE DIMESYLATE 30 MG PO CAPS: 30.0000 mg | ORAL_CAPSULE | Freq: Every morning | ORAL | 0 refills | Status: DC

## 2020-01-20 MED ORDER — VYVANSE 70 MG PO CAPS: 70.0000 mg | ORAL_CAPSULE | Freq: Every morning | ORAL | 0 refills | Status: DC

## 2020-01-20 NOTE — Telephone Encounter (Signed)
RX for above e-scribed and sent to pharmacy on record  [id number] [place], [place] - [address] [place]  [address] Phone: [phone] Fax: [phone] 

## 2020-01-20 NOTE — Telephone Encounter (Signed)
Patient called in for refill for Vyvanse. Last visit 10/18/2019 next visit 02/17/2020. [facility] [address] °

## 2020-01-24 MED ORDER — LISDEXAMFETAMINE DIMESYLATE 30 MG PO CAPS: 30.0000 mg | ORAL_CAPSULE | Freq: Every morning | ORAL | 0 refills | Status: DC

## 2020-01-24 NOTE — Telephone Encounter (Signed)
RX for above e-scribed and sent to pharmacy on record  [id number] - [place], [place] - [address] [place] [address] Phone: [phone] Fax: [phone]    

## 2020-01-24 NOTE — Addendum Note (Signed)
Addended by: Laelah Siravo A on: 01/24/2020 09:44 AM   Modules accepted: Orders

## 2020-01-24 NOTE — Addendum Note (Signed)
Addended by: Burgess Estelle on: 01/24/2020 09:38 AM   Modules accepted: Orders

## 2020-01-24 NOTE — Telephone Encounter (Signed)
[facility] in [facility] that [facility] [address] [facility]mg  in [facility]

## 2020-02-17 ENCOUNTER — Telehealth: Payer: BC Managed Care – PPO | Admitting: Pediatrics

## 2020-02-29 ENCOUNTER — Telehealth (INDEPENDENT_AMBULATORY_CARE_PROVIDER_SITE_OTHER): Payer: BC Managed Care – PPO | Admitting: Pediatrics

## 2020-02-29 ENCOUNTER — Encounter: Payer: Self-pay | Admitting: Pediatrics

## 2020-02-29 ENCOUNTER — Other Ambulatory Visit: Payer: Self-pay

## 2020-02-29 DIAGNOSIS — Z79899 Other long term (current) drug therapy: Secondary | ICD-10-CM | POA: Diagnosis not present

## 2020-02-29 DIAGNOSIS — F902 Attention-deficit hyperactivity disorder, combined type: Secondary | ICD-10-CM

## 2020-02-29 DIAGNOSIS — F411 Generalized anxiety disorder: Secondary | ICD-10-CM

## 2020-02-29 DIAGNOSIS — Z7189 Other specified counseling: Secondary | ICD-10-CM

## 2020-02-29 DIAGNOSIS — Z719 Counseling, unspecified: Secondary | ICD-10-CM

## 2020-02-29 DIAGNOSIS — R278 Other lack of coordination: Secondary | ICD-10-CM

## 2020-02-29 MED ORDER — FLUOXETINE HCL 20 MG PO CAPS: 20.0000 mg | ORAL_CAPSULE | Freq: Every day | ORAL | 0 refills | Status: DC

## 2020-02-29 MED ORDER — LISDEXAMFETAMINE DIMESYLATE 30 MG PO CAPS: 30.0000 mg | ORAL_CAPSULE | Freq: Every morning | ORAL | 0 refills | Status: DC

## 2020-02-29 MED ORDER — VYVANSE 70 MG PO CAPS
70.0000 mg | ORAL_CAPSULE | Freq: Every morning | ORAL | 0 refills | Status: DC
Start: 2020-02-29 — End: 2020-04-24

## 2020-02-29 NOTE — Patient Instructions (Addendum)
DISCUSSION: Counseled regarding the following coordination of care items:  Continue medication as directed Vyvanse 70 mg and 30 mg every morning Prozac 20 mg every morning RX for above e-scribed and sent to pharmacy on record  [facility] to [facility]  [id number] [place], [place] - [address] [place] [address] Phone: [phone] Fax: [phone]  [id number] - [place], [place] - [address] [place] [address] Phone: [phone] Fax: [phone]  Counseled regarding obtaining refills by calling pharmacy first to use automated refill request then if needed, call our office leaving a detailed message on the refill line.  Counseled medication administration, effects, and possible side effects.  ADHD medications discussed to include different medications and pharmacologic properties of each. Recommendation for specific medication to include dose, administration, expected effects, possible side effects and the risk to benefit ratio of medication management.  Advised importance of:  Good sleep hygiene (8- 10 hours per night)  Limited screen time (none on school nights, no more than 2 hours on weekends)  Regular exercise(outside and active play)  Healthy eating (drink water, no sodas/sweet tea)  11 Tips to Follow:  1. No caffeine after 3pm: Avoid beverages with caffeine (soda, tea, energy drinks, etc.) especially after 3pm. 2. Don't go to bed hungry: Have your evening meal at least 3 hrs. before going to sleep. It's fine to have a small bedtime snack such as a glass of milk and a few crackers but don't have a big meal. 3. Have a nightly routine before bed: Plan on "winding down" before you go to sleep. Begin relaxing about 1 hour before you go to bed. Try doing a quiet activity such as listening to calming music, reading a book or meditating. 4. Turn off the TV and ALL  electronics including video games, tablets, laptops, etc. 1 hour before sleep, and keep them out of the bedroom. 5. Turn off your cell phone and all notifications (new email and text alerts) or even better, leave your phone outside your room while you sleep. Studies have shown that a part of your brain continues to respond to certain lights and sounds even while you're still asleep. 6. Make your bedroom quiet, dark and cool. If you can't control the noise, try wearing earplugs or using a fan to block out other sounds. 7. Practice relaxation techniques. Try reading a book or meditating or drain your brain by writing a list of what you need to do the next day. 8. Don't nap unless you feel sick: you'll have a better night's sleep. 9. Don't smoke, or quit if you do. Nicotine, alcohol, and marijuana can all keep you awake. Talk to your health care provider if you need help with substance use. 10. Most importantly, wake up at the same time every day (or within 1 hour of your usual wake up time) EVEN on the weekends. A regular wake up time promotes sleep hygiene and prevents sleep problems. 11. Reduce exposure to bright light in the last three hours of the day before going to sleep. Maintaining good sleep hygiene and having good sleep habits lower your risk of developing sleep problems. Getting better sleep can also improve your concentration and alertness. Try the simple steps in this guide. If you still have trouble getting enough rest, make an appointment with your health care provider.  Counseling at this visit included the review of old records and/or  current chart.   Counseling included the following discussion points presented at every visit to improve understanding and treatment compliance.  Recent health history and today's examination Growth and development with anticipatory guidance provided regarding brain growth, executive function maturation and pre or pubertal development. School progress and  continued advocay for appropriate accommodations to include maintain Structure, routine, organization, reward, motivation and consequences.  Additionally the patient was counseled to take medication while driving.

## 2020-02-29 NOTE — Progress Notes (Addendum)
[facility] [address] [place] [place] [address] Dept: [phone] Dept Fax: [phone]  Medication Check by Caregility due to COVID-19  Patient ID: Susan Dyer  DOB: [birth year]  MRN: [id number]  DATE:02/29/20 [name], MD   Interviewed: Reina Boyte Location: Her apartment Provider location: [facility] office  Virtual Visit via Video Note Connected with [employer] by video enabled telemedicine application and verified that I am speaking with the correct person using two identifiers.     I discussed the limitations, risks, security and privacy concerns of performing an evaluation and management service by telephone and the availability of in person appointments. I also discussed with the parent/patient that there may be a patient responsible charge related to this service. The parent/patient expressed understanding and agreed to proceed.  Lives: in apartment with two people Dietitian - not a Clinical biochemist - they are not speaking currently due to coughing out loud)  Neighbors complained, had to speak to Production designer, theatre/television/film.  Somewhat connected to student housing. Shares living room, kitchen and washer dryer. Has own room and bathroom.  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  Last in person March 2021 and last video visit on June 2021.  Currently prescribed  Vyvanse 70 mg and 30 mg every morning with Prozac 20 mg every morning.  Reports some school stress, sleep issues and Some OCD more lately with hand washing.  EDUCATION: School: [employer] Year/Grade: Junior year  Poor on exams  M, W, F - 0840 organic chemistry (acutally passing but lower grade), cellular biology (challenges with teacher, essay format, not multiple choice), Modified Spanish T, Th -  English Counseled to discuss accommodations with DSO at school  Activities/ Exercise: daily  [employer] on Wednesday  Screen time: (phone, tablet, TV, computer): excessive watching TV late at night  Driving: doing well  MEDICAL HISTORY: Appetite: WNL   Sleep: Bedtime: 0100 - 0200 up late watching TV   Awakens: School up by 0800 or 1130   Concerns: Initiation/Maintenance/Other: Asleep easily, sleeps through the night, feels well-rested.  No Sleep concerns. Counseled to keep routine, go to bed earlier Elimination: no concerns  Individual Medical History/ Review of Systems: Changes? :No Bronchitis -  recently Had flu shot - lost voice, so delayed Covid Gets Covid tested every two weeks, has been negative.  Family Medical/ Social History: Changes? No  Current Medications:  Vyvanse 70 mg and 30 mg every morning Prozac 20 mg every morning Adderall 10 mg takes half prn Medication Side Effects: None  MENTAL HEALTH: Mental Health Issues:  Some increased OCD, repeating prayers and having to go back and do over if makes a mistake Review of Systems  Constitutional: Negative for fatigue.  HENT: Negative.   Eyes: Negative.   Respiratory: Negative.   Cardiovascular:       Bouts of tachycardia and reflux  Gastrointestinal: Negative.   Endocrine: Negative.   Genitourinary: Negative.   Musculoskeletal: Negative.   Skin: Negative.   Allergic/Immunologic: Negative.   Neurological: Negative for seizures and headaches.  Hematological: Negative.   Psychiatric/Behavioral: Positive for sleep disturbance. Negative for agitation, behavioral problems and decreased concentration. The patient is nervous/anxious. The patient is not hyperactive.   All other systems reviewed and are negative.   DIAGNOSES:    ICD-10-CM   1. ADHD (attention deficit hyperactivity  disorder), combined type  F90.2   2. Dysgraphia  R27.8   3. Generalized anxiety disorder  F41.1   4. Medication management  Z79.899    5. Patient counseled  Z71.9   6. Counseling and coordination of care  Z71.89     RECOMMENDATIONS:  Patient Instructions  DISCUSSION: Counseled regarding the following coordination of care items:  Continue medication as directed Vyvanse 70 mg and 30 mg every morning Prozac 20 mg every morning RX for above e-scribed and sent to pharmacy on record  [facility] to [facility]  [id number] [place], [place] - [address] [place] [address] Phone: [phone] Fax: [phone]  [facility] - [place], [place] - [address] [place] [address] Phone: [phone] Fax: [phone]  Counseled regarding obtaining refills by calling pharmacy first to use automated refill request then if needed, call our office leaving a detailed message on the refill line.  Counseled medication administration, effects, and possible side effects.  ADHD medications discussed to include different medications and pharmacologic properties of each. Recommendation for specific medication to include dose, administration, expected effects, possible side effects and the risk to benefit ratio of medication management.  Advised importance of:  Good sleep hygiene (8- 10 hours per night)  Limited screen time (none on school nights, no more than 2 hours on weekends)  Regular exercise(outside and active play)  Healthy eating (drink water, no sodas/sweet tea)  11 Tips to Follow:  1. No caffeine after 3pm: Avoid beverages with caffeine (soda, tea, energy drinks, etc.) especially after 3pm. 2. Don't go to bed hungry: Have your evening meal at least 3 hrs. before going to sleep. It's fine to have a small bedtime snack such as a glass of milk and a few crackers but don't have a big meal. 3. Have a nightly routine before bed: Plan on "winding down" before you go to sleep. Begin relaxing about 1 hour before you go to  bed. Try doing a quiet activity such as listening to calming music, reading a book or meditating. 4. Turn off the TV and ALL electronics including video games, tablets, laptops, etc. 1 hour before sleep, and keep them out of the bedroom. 5. Turn off your cell phone and all notifications (new email and text alerts) or even better, leave your phone outside your room while you sleep. Studies have shown that a part of your brain continues to respond to certain lights and sounds even while you're still asleep. 6. Make your bedroom quiet, dark and cool. If you can't control the noise, try wearing earplugs or using a fan to block out other sounds. 7. Practice relaxation techniques. Try reading a book or meditating or drain your brain by writing a list of what you need to do the next day. 8. Don't nap unless you feel sick: you'll have a better night's sleep. 9. Don't smoke, or quit if you do. Nicotine, alcohol, and marijuana can all keep you awake. Talk to your health care provider if you need help with substance use. 10. Most importantly, wake up at the same time every day (or within 1 hour of your usual wake up time) EVEN on the weekends. A regular wake up time promotes sleep hygiene and prevents sleep problems. 11. Reduce exposure to bright light in the last three hours of the day before going to sleep. Maintaining good sleep hygiene and having good sleep  habits lower your risk of developing sleep problems. Getting better sleep can also improve your concentration and alertness. Try the simple steps in this guide. If you still have trouble getting enough rest, make an appointment with your health care provider.  Counseling at this visit included the review of old records and/or current chart.   Counseling included the following discussion points presented at every visit to improve understanding and treatment compliance.  Recent health history and today's examination Growth and development with anticipatory  guidance provided regarding brain growth, executive function maturation and pre or pubertal development. School progress and continued advocay for appropriate accommodations to include maintain Structure, routine, organization, reward, motivation and consequences.  Additionally the patient was counseled to take medication while driving.     NEXT APPOINTMENT:  Return in about 3 months (around 05/31/2020) for Medical Follow up. edical Decision-making: More than 50% of the appointment was spent counseling and discussing diagnosis and management of symptoms with the parent/patient.  I discussed the assessment and treatment plan with the parent. The parent/patient was provided an opportunity to ask questions and all were answered. The parent/patient agreed with the plan and demonstrated an understanding of the instructions.   The parent/patient was advised to call back or seek an in-person evaluation if the symptoms worsen or if the condition fails to improve as anticipated.  I provided 25 minutes of non-face-to-face time during this encounter.   Completed record review for 0 minutes prior to the virtual video visit.   [name] Penna, NP   Counseling Time: 25 minutes   Total Contact Time: 25 minutes

## 2020-04-24 ENCOUNTER — Other Ambulatory Visit: Payer: Self-pay | Admitting: Pediatrics

## 2020-04-24 MED ORDER — LISDEXAMFETAMINE DIMESYLATE 30 MG PO CAPS
30.0000 mg | ORAL_CAPSULE | Freq: Every morning | ORAL | 0 refills | Status: DC
Start: 2020-04-24 — End: 2020-05-24

## 2020-04-24 MED ORDER — VYVANSE 70 MG PO CAPS
70.0000 mg | ORAL_CAPSULE | Freq: Every morning | ORAL | 0 refills | Status: DC
Start: 2020-04-24 — End: 2020-05-24

## 2020-04-24 NOTE — Telephone Encounter (Signed)
RX for above e-scribed and sent to pharmacy on record  [id number] [place], [place] - [address] [place] [address] Phone: [phone] Fax: [phone] 

## 2020-04-24 NOTE — Telephone Encounter (Signed)
Patient called for refills for Vyvanse 30mg  and Vyvanse 70mg .  Patient last seen 02/29/20, next appointment 06/07/20.  [facility] [address]

## 2020-05-24 ENCOUNTER — Other Ambulatory Visit: Payer: Self-pay

## 2020-05-24 MED ORDER — VYVANSE 70 MG PO CAPS: 70.0000 mg | ORAL_CAPSULE | Freq: Every morning | ORAL | 0 refills | Status: DC

## 2020-05-24 MED ORDER — LISDEXAMFETAMINE DIMESYLATE 30 MG PO CAPS
30.0000 mg | ORAL_CAPSULE | Freq: Every morning | ORAL | 0 refills | Status: DC
Start: 2020-05-24 — End: 2020-06-22

## 2020-05-24 NOTE — Telephone Encounter (Signed)
RX for above e-scribed and sent to pharmacy on record  [id number] - [place], [place] - [address] [place] [address] Phone: [phone] Fax: [phone] 

## 2020-05-24 NOTE — Telephone Encounter (Signed)
St visit 02/29/2020 next visit 06/07/2020

## 2020-06-07 ENCOUNTER — Telehealth (INDEPENDENT_AMBULATORY_CARE_PROVIDER_SITE_OTHER): Payer: BC Managed Care – PPO | Admitting: Pediatrics

## 2020-06-07 ENCOUNTER — Other Ambulatory Visit: Payer: Self-pay

## 2020-06-07 ENCOUNTER — Encounter: Payer: Self-pay | Admitting: Pediatrics

## 2020-06-07 DIAGNOSIS — F411 Generalized anxiety disorder: Secondary | ICD-10-CM | POA: Diagnosis not present

## 2020-06-07 DIAGNOSIS — Z719 Counseling, unspecified: Secondary | ICD-10-CM

## 2020-06-07 DIAGNOSIS — Z79899 Other long term (current) drug therapy: Secondary | ICD-10-CM | POA: Diagnosis not present

## 2020-06-07 DIAGNOSIS — Z7189 Other specified counseling: Secondary | ICD-10-CM

## 2020-06-07 DIAGNOSIS — F422 Mixed obsessional thoughts and acts: Secondary | ICD-10-CM | POA: Diagnosis not present

## 2020-06-07 DIAGNOSIS — F902 Attention-deficit hyperactivity disorder, combined type: Secondary | ICD-10-CM | POA: Diagnosis not present

## 2020-06-07 MED ORDER — FLUOXETINE HCL 20 MG PO CAPS: 20.0000 mg | ORAL_CAPSULE | Freq: Every day | ORAL | 0 refills | Status: DC

## 2020-06-07 NOTE — Progress Notes (Signed)
[facility] [address], [place]. [address] [place] [address] Dept: [phone] Dept Fax: [phone]  Medication Check by Caregility due to COVID-19  Patient ID:  Susan Dyer  female DOB: [birth year]   20 y.o.   MRN: [id number]   DATE:06/07/20  PCP: [name], MD  Interviewed: Oakley Cleere  Location: dorm room in [place] Provider location: [place], [place] - private residence, no others present  Virtual Visit via Video Note Connected with [employer] on 06/07/20 at  3:00 PM EST by video enabled telemedicine application and verified that I am speaking with the correct person using two identifiers.     I discussed the limitations, risks, security and privacy concerns of performing an evaluation and management service by telephone and the availability of in person appointments. I also discussed with the parent/patient that there may be a patient responsible charge related to this service. The parent/patient expressed understanding and agreed to proceed.  HISTORY OF PRESENT ILLNESS/CURRENT STATUS: Syndey Dyer is being followed for medication management for ADHD, dysgraphia and Anxiety with OCD.   Last visit on 02/29/20  Tavonna currently prescribed Vyvanse 70 mg and 30 mg every morning, adderall 10 mg as needed for late classes and Prozac 20 mg every morning    Behaviors: challenges discussed today include flare up of OCD behaviors, described as getting stuck on thoughts, ruminating over an idea or image, needing to count or compulsively wash hands. Has not found counselor.  Feels many images reflect past trauma regarding LGBTQ issues (coming out, social pressure, feelings of shame)  Eating well (eating breakfast, lunch and dinner).  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 :No  Family Medical/ Social History: Changes? No    MENTAL HEALTH: Denies suicidal thoughts, actions. Not feeling depressed, just OCD flares are distressing and she tries hard to stop the thoughts  ASSESSMENT: 20 year old with continued and recent exacerbation of OCD behaviors.  Not in counseling which would greatly help and I do feel that EMDR therapy with CBT may help with the past traumatic experiences (parental divorce, witness domestic issues between parents, social issues regarding being gay). I provided some strategies to try (deep breathing, tapping and guided imagery).  I emailed lists of providers for EMDR and CBT. DIAGNOSES:    ICD-10-CM   1. ADHD (attention deficit hyperactivity disorder), combined type  F90.2   2. Generalized anxiety disorder  F41.1   3. Mixed obsessional thoughts and acts  F42.2   4. Medication management  Z79.899   5. Patient counseled  Z71.9   6. Counseling and coordination of care  Z71.89      RECOMMENDATIONS:  Patient Instructions  DISCUSSION: Counseled regarding the following coordination of care items:  Continue medication as directed Vyvanse 70 mg and 30 mg every morning Prozac 20 mg every morning Adderall 10 mg as needed for late classes RX for above e-scribed and sent to pharmacy on record  [id number] [place], [place] - [address] [place] [address] Phone: [phone] Fax: [phone]  Counseled regarding obtaining refills by calling pharmacy first to use automated refill request then if needed, call our office leaving a detailed message on the refill line.  Counseled medication administration, effects, and possible side effects.  ADHD  medications discussed to include different medications and pharmacologic properties of each. Recommendation for specific medication to  include dose, administration, expected effects, possible side effects and the risk to benefit ratio of medication management.  Therapist suggestions emailed to patient     NEXT APPOINTMENT:  Return in about 3 months (around 09/05/2020) for Medical Follow up. Please call the office for a sooner appointment if problems arise.  Medical Decision-making:  I spent 40 minutes dedicated to the care of this patient on the date of this encounter to include face to face time with the patient and/or parent reviewing medical records and documentation by teachers, performing and discussing the assessment and treatment plan, reviewing and explaining completed speciality labs and obtaining specialty lab samples.  The patient and/or parent was provided an opportunity to ask questions and all were answered. The patient and/or parent agreed with the plan and demonstrated an understanding of the instructions.   The patient and/or parent was advised to call back or seek an in-person evaluation if the symptoms worsen or if the condition fails to improve as anticipated.  I provided 40 minutes of non-face-to-face time during this encounter.   Completed record review for 15 minutes prior to and after the virtual video visit.   Counseling Time: 40 minutes   Total Contact Time: 55 minutes

## 2020-06-07 NOTE — Patient Instructions (Signed)
DISCUSSION: Counseled regarding the following coordination of care items:  Continue medication as directed Vyvanse 70 mg and 30 mg every morning Prozac 20 mg every morning Adderall 10 mg as needed for late classes RX for above e-scribed and sent to pharmacy on record  [id number] [place], [place] - [address] [place] [address] Phone: [phone] Fax: [phone]  Counseled regarding obtaining refills by calling pharmacy first to use automated refill request then if needed, call our office leaving a detailed message on the refill line.  Counseled medication administration, effects, and possible side effects.  ADHD medications discussed to include different medications and pharmacologic properties of each. Recommendation for specific medication to include dose, administration, expected effects, possible side effects and the risk to benefit ratio of medication management.  Therapist suggestions emailed to patient

## 2020-06-13 ENCOUNTER — Other Ambulatory Visit: Payer: Self-pay

## 2020-06-13 NOTE — Telephone Encounter (Signed)
Rx was sent to wrong Pharm needs to be sent to [facility]

## 2020-06-14 MED ORDER — FLUOXETINE HCL 20 MG PO CAPS: 20.0000 mg | ORAL_CAPSULE | Freq: Every day | ORAL | 0 refills | Status: DC

## 2020-06-14 NOTE — Telephone Encounter (Signed)
RX for above e-scribed and sent to pharmacy on record  [facility] - [place], [place] - [address] [place] [address] Phone: [phone] Fax: [phone]   

## 2020-06-22 ENCOUNTER — Other Ambulatory Visit: Payer: Self-pay

## 2020-06-22 MED ORDER — LISDEXAMFETAMINE DIMESYLATE 30 MG PO CAPS
30.0000 mg | ORAL_CAPSULE | Freq: Every morning | ORAL | 0 refills | Status: DC
Start: 2020-06-22 — End: 2020-07-23

## 2020-06-22 MED ORDER — VYVANSE 70 MG PO CAPS
70.0000 mg | ORAL_CAPSULE | Freq: Every morning | ORAL | 0 refills | Status: DC
Start: 2020-06-22 — End: 2020-07-23

## 2020-06-22 NOTE — Telephone Encounter (Signed)
Vyvanse 70 mg daily and 30 mg daily, # 30 each Rx with no RF's.RX for above e-scribed and sent to pharmacy on record  [phone] [place], [place] - [address] [place] [address] Phone: [phone] Fax: [phone]

## 2020-06-22 NOTE — Telephone Encounter (Signed)
Last visit 06/07/2020  

## 2020-06-29 ENCOUNTER — Other Ambulatory Visit: Payer: Self-pay

## 2020-06-29 MED ORDER — AMPHETAMINE-DEXTROAMPHETAMINE 10 MG PO TABS: 10.0000 mg | ORAL_TABLET | Freq: Every day | ORAL | 0 refills | Status: DC | PRN

## 2020-06-29 NOTE — Telephone Encounter (Signed)
Adderall 10 mg prn for evening time, # 30 with no RF's.RX for above e-scribed and sent to pharmacy on record  [phone] [place], [place] - [address] [place] [address] Phone: [phone] Fax: [phone]

## 2020-06-29 NOTE — Telephone Encounter (Signed)
Last visit 06/07/2020  

## 2020-07-23 ENCOUNTER — Other Ambulatory Visit: Payer: Self-pay

## 2020-07-23 MED ORDER — VYVANSE 70 MG PO CAPS: 70.0000 mg | ORAL_CAPSULE | Freq: Every morning | ORAL | 0 refills | Status: DC

## 2020-07-23 MED ORDER — LISDEXAMFETAMINE DIMESYLATE 30 MG PO CAPS: 30.0000 mg | ORAL_CAPSULE | Freq: Every morning | ORAL | 0 refills | Status: DC

## 2020-07-23 NOTE — Telephone Encounter (Signed)
Last visit 06/07/2020

## 2020-07-23 NOTE — Telephone Encounter (Signed)
RX for above e-scribed and sent to pharmacy on record  [id number] - [place], [place] - [address]  [place] [address] Phone: [phone] Fax: [phone] 

## 2020-08-22 ENCOUNTER — Telehealth: Payer: Self-pay

## 2020-08-22 MED ORDER — LISDEXAMFETAMINE DIMESYLATE 30 MG PO CAPS: 30.0000 mg | ORAL_CAPSULE | Freq: Every morning | ORAL | 0 refills | Status: DC

## 2020-08-22 MED ORDER — VYVANSE 70 MG PO CAPS: 70.0000 mg | ORAL_CAPSULE | Freq: Every morning | ORAL | 0 refills | Status: DC

## 2020-08-22 NOTE — Telephone Encounter (Signed)
E-Prescribed Vyvanse 70 and Vyvanse 30 directly to  [id number] [place], [place] - [address] [place] [address] Phone: [phone] Fax: [phone]

## 2020-08-22 NOTE — Telephone Encounter (Signed)
Vyv 70mg  Vy[facility]. 30mg  [facility] [address] [phone]

## 2020-09-21 ENCOUNTER — Other Ambulatory Visit: Payer: Self-pay

## 2020-09-21 MED ORDER — LISDEXAMFETAMINE DIMESYLATE 30 MG PO CAPS: 30.0000 mg | ORAL_CAPSULE | Freq: Every morning | ORAL | 0 refills | Status: DC

## 2020-09-21 MED ORDER — VYVANSE 70 MG PO CAPS: 70.0000 mg | ORAL_CAPSULE | Freq: Every morning | ORAL | 0 refills | Status: DC

## 2020-09-21 NOTE — Telephone Encounter (Signed)
RX for above e-scribed and sent to pharmacy on record  [id number] - [place], [place] - [address] [place] [address] Phone: [phone] Fax: [phone] 

## 2020-10-02 ENCOUNTER — Other Ambulatory Visit: Payer: Self-pay

## 2020-10-03 MED ORDER — FLUOXETINE HCL 20 MG PO CAPS: 20.0000 mg | ORAL_CAPSULE | Freq: Every day | ORAL | 0 refills | Status: DC

## 2020-10-03 NOTE — Telephone Encounter (Signed)
RX for above e-scribed and sent to pharmacy on record  [facility] - [place], [place] - [address] [place] [address] Phone: [phone] Fax: [phone]   

## 2020-10-23 ENCOUNTER — Other Ambulatory Visit: Payer: Self-pay

## 2020-10-23 MED ORDER — VYVANSE 70 MG PO CAPS: 70.0000 mg | ORAL_CAPSULE | Freq: Every morning | ORAL | 0 refills | Status: DC

## 2020-10-23 MED ORDER — LISDEXAMFETAMINE DIMESYLATE 30 MG PO CAPS: 30.0000 mg | ORAL_CAPSULE | Freq: Every morning | ORAL | 0 refills | Status: DC

## 2020-10-23 NOTE — Telephone Encounter (Signed)
Next appt: 10/25/2020  E-Prescribed Vyvanse 70 and 30 directly to  [id number] [place], [place] - [address] [place] [address] Phone: [phone] Fax: [phone]

## 2020-10-25 ENCOUNTER — Other Ambulatory Visit: Payer: Self-pay

## 2020-10-25 ENCOUNTER — Encounter: Payer: Self-pay | Admitting: Pediatrics

## 2020-10-25 ENCOUNTER — Ambulatory Visit: Payer: BC Managed Care – PPO | Admitting: Pediatrics

## 2020-10-25 VITALS — BP 110/70 | HR 89 | Ht 66.5 in | Wt 172.0 lb

## 2020-10-25 DIAGNOSIS — Z719 Counseling, unspecified: Secondary | ICD-10-CM

## 2020-10-25 DIAGNOSIS — Z79899 Other long term (current) drug therapy: Secondary | ICD-10-CM

## 2020-10-25 DIAGNOSIS — F422 Mixed obsessional thoughts and acts: Secondary | ICD-10-CM

## 2020-10-25 DIAGNOSIS — F902 Attention-deficit hyperactivity disorder, combined type: Secondary | ICD-10-CM | POA: Diagnosis not present

## 2020-10-25 DIAGNOSIS — R278 Other lack of coordination: Secondary | ICD-10-CM

## 2020-10-25 MED ORDER — FLUOXETINE HCL 10 MG PO CAPS: 10.0000 mg | ORAL_CAPSULE | ORAL | 0 refills | Status: DC

## 2020-10-25 NOTE — Patient Instructions (Addendum)
DISCUSSION: Counseled regarding the following coordination of care items:  Continue medication as directed Vyvanse 70 mg and 30 mg every morning Trial add Prozac 10 mg to the prozac 20 mg  for total daily dose of 30 mg Continue Intuniv 4 mg every morning RX for above e-scribed and sent to pharmacy on record  [id number] [place], [place] - [address] [place] [address] Phone: [phone] Fax: [phone]  Maintain good sleep routines through the summer attempting to have bedtime before 2400.  Set a schedule to wake up every morning consistently and take daily medication. Attempt to get into counseling to deal with stress and OCD behaviors. Continue good physical active play and activities try and build in some aerobic activities. Reduce screen time as this contributes to "fear of missing out" and more stress thinking. Improve sources of dietary protein and avoid extra calories and junk food.  Drink water and maintain good hydration status.

## 2020-10-25 NOTE — Progress Notes (Signed)
Medication Check  Patient ID: Susan Dyer  DOB: [birth year]  MRN: [id number]  DATE:10/25/20 Susan Salmon, MD  Accompanied by: self Patient Lives with: two roommates in apartment Own room and own bathroom Shares kitchen, living room, washer and dryer  HISTORY/CURRENT STATUS: Chief Complaint - Polite and cooperative and present for medical follow up for medication management of ADHD, dysgraphia and learning differences with Anxiety.  Last follow up June 07, 2020.  Currently prescribed Vyvanse 70 mg and Vyvanse 30 mg every morning with Intuniv 4 mg every morning and fluoxetine 20 mg every morning.  Reports that she is in a good place with focus and follow-through and feels that overall there is less stress however her OCD behaviors have increased.    EDUCATION: School: [employer] Year/Grade: rising 4th year Has some credits to catch up on. 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  No Sleep concerns. Will wake with headache with more sleep.  Elimination: no concerns  Individual Medical History/ Review of Systems: Changes? :No  Family Medical/ Social History: Changes? No  MENTAL HEALTH: Anger is better. OCD is somewhat worse.  Has to repeat phrases, aware of the jeans, holding keys a certain way, lights on and off. Recent break-up with girlfriend of 2 years and seemed somewhat nonchalant regarding this and is dating again.  PHYSICAL EXAM; Vitals:   10/25/20  1401  BP: 110/70  Pulse: 89  SpO2: 98%  Weight: 172 lb (78 kg)  Height: 5' 6.5" (1.689 m)   Body mass index is 27.35 kg/m.  General Physical Exam: Unchanged from previous exam, date: 06/07/2020  ASSESSMENT:  Susan Dyer is a 20 year old with a diagnosis of ADHD/dysgraphia with OCD and anxiety.  We will increase fluoxetine to see if this will assist with the intrusive thoughts and actions.  I do recommend attempting to find a counselor for CBT.  No changes to other medication and I do recommend continued good physical activity and work.  Maintain good bedtime so that sleep is not a factor in the OCD thoughts.  As well as more protein with good hydration has she is experiencing headaches most mornings which may be due to lack of water intake.  Blood pressure was normal. ADHD stable with medication management and the comorbid symptomatology of anxiety/OCD is typically the more pressing issue through the summer.  DIAGNOSES:    ICD-10-CM   1. ADHD (attention deficit hyperactivity disorder), combined type  F90.2     2. Dysgraphia  R27.8     3. Mixed obsessional thoughts and acts  F42.2     4. Medication management  Z79.899     5. Patient counseled  Z71.9       RECOMMENDATIONS:  Patient Instructions  DISCUSSION: Counseled regarding the following coordination of care items:  Continue medication as directed Vyvanse 70 mg and 30 mg every morning Trial add Prozac 10 mg to the prozac 20 mg  for total daily dose of 30 mg Continue Intuniv 4 mg every morning RX for  above e-scribed and sent to pharmacy on record  [phone] [place], [place] - [address] [place] [address] Phone: [phone] Fax: [phone]  Maintain good sleep routines through the summer attempting to have bedtime before 2400.  Set a schedule to wake up every morning consistently and take daily medication. Attempt to get into counseling to deal with stress and OCD  behaviors. Continue good physical active play and activities try and build in some aerobic activities. Reduce screen time as this contributes to "fear of missing out" and more stress thinking. Improve sources of dietary protein and avoid extra calories and junk food.  Drink water and maintain good hydration status.  Patient verbalized understanding of all topics discussed.  NEXT APPOINTMENT:  Return in about 3 months (around 01/25/2021) for Medication Check.  Disclaimer: This documentation was generated through the use of dictation and/or voice recognition software, and as such, may contain spelling or other transcription errors. Please disregard any inconsequential errors.  Any questions regarding the content of this documentation should be directed to the individual who electronically signed.

## 2020-11-23 ENCOUNTER — Other Ambulatory Visit: Payer: Self-pay

## 2020-11-23 MED ORDER — LISDEXAMFETAMINE DIMESYLATE 30 MG PO CAPS: 30.0000 mg | ORAL_CAPSULE | Freq: Every morning | ORAL | 0 refills | Status: DC

## 2020-11-23 MED ORDER — VYVANSE 70 MG PO CAPS: 70.0000 mg | ORAL_CAPSULE | Freq: Every morning | ORAL | 0 refills | Status: DC

## 2020-11-23 NOTE — Telephone Encounter (Signed)
Vyvanse 70 mg daily and 30 mg daily, # 30 each Rx with no RF's.RX for above e-scribed and sent to pharmacy on record  [phone] [place], [place] - [address] [place] [address] Phone: [phone] Fax: [phone]

## 2020-12-20 ENCOUNTER — Other Ambulatory Visit: Payer: Self-pay

## 2020-12-20 ENCOUNTER — Encounter: Payer: Self-pay | Admitting: Pediatrics

## 2020-12-20 ENCOUNTER — Ambulatory Visit: Payer: BC Managed Care – PPO | Admitting: Pediatrics

## 2020-12-20 VITALS — Ht 66.5 in | Wt 169.0 lb

## 2020-12-20 DIAGNOSIS — F411 Generalized anxiety disorder: Secondary | ICD-10-CM

## 2020-12-20 DIAGNOSIS — F422 Mixed obsessional thoughts and acts: Secondary | ICD-10-CM | POA: Diagnosis not present

## 2020-12-20 DIAGNOSIS — F902 Attention-deficit hyperactivity disorder, combined type: Secondary | ICD-10-CM

## 2020-12-20 DIAGNOSIS — Z79899 Other long term (current) drug therapy: Secondary | ICD-10-CM

## 2020-12-20 DIAGNOSIS — R278 Other lack of coordination: Secondary | ICD-10-CM | POA: Diagnosis not present

## 2020-12-20 DIAGNOSIS — Z719 Counseling, unspecified: Secondary | ICD-10-CM

## 2020-12-20 MED ORDER — VYVANSE 70 MG PO CAPS: 70.0000 mg | ORAL_CAPSULE | Freq: Every morning | ORAL | 0 refills | Status: DC

## 2020-12-20 MED ORDER — LISDEXAMFETAMINE DIMESYLATE 30 MG PO CAPS: 30.0000 mg | ORAL_CAPSULE | Freq: Every morning | ORAL | 0 refills | Status: DC

## 2020-12-20 NOTE — Patient Instructions (Signed)
DISCUSSION: Counseled regarding the following coordination of care items:  Continue medication as directed Vyvanse 70 mg and 30 mg every morning Prozac 20 and 10 mg every morning Adderall 10 m prn RX for above e-scribed and sent to pharmacy on record  [facility] [phone] [place], [place] - [address] [place] [address] Phone: [phone] Fax: [phone]  Advised importance of:  Sleep Maintain good sleep routines and set schedules Limited screen time (none on school nights, no more than 2 hours on weekends) Reduce all screen time especially social media Regular exercise(outside and active play) Continue good physical activities Healthy eating (drink water, no sodas/sweet tea) Maintain good dietary protein and avoiding junk food and empty calories   Counseling is recommended and may include Family counseling.  Consider the following options: [facility]  [email or web address] [phone]

## 2020-12-20 NOTE — Progress Notes (Signed)
Medication Check  Patient ID: Susan Dyer  DOB: [birth year]  MRN: [id number]  DATE:12/20/20 Susan Salmon, MD  Accompanied by: Self Patient Lives with: has apartment   HISTORY/CURRENT STATUS: Chief Complaint - Polite and cooperative and present for medical follow up for medication management of ADHD, dysgraphia and anxiety with OCD features.  Currently prescribed Vyvanse 70/30 mg every morning and fluoxetine 30 mg every morning.  Recently discontinued Kelnor birth control and is now going to start a lower dose probably Junel 20 mcg.  Feels that moods are improved at however did not report significant change in feeling with dose increase of fluoxetine.  In new relationship and improved confidence this visit.   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  Screen time: (phone, tablet, TV, computer): not excessive   Driving: no issues  MEDICAL HISTORY: Appetite: WNL   Sleep: No concerns  elimination: No concerns Patient's last menstrual period was 11/27/2020 (approximate). Recent OB/GYN update 8/4/20202 with Pap smear and blood work. Individual Medical History/ Review of Systems: Changes? :No  Family Medical/ Social History: Changes? No  MENTAL HEALTH: No concerns  PHYSICAL EXAM; Vitals:   12/20/20 1409  Weight: 169 lb (76.7 kg)  Height: 5' 6.5" (1.689 m)   Body mass index is 26.87 kg/m.  General Physical Exam: Unchanged from previous exam, date: 10/25/2020   ASSESSMENT:  Susan Dyer is a 20 year old with a diagnosis of ADHD/dysgraphia with anxiety and OCD features that is improved and currently well controlled.  No changes to ADHD medication nor fluoxetine.  Recent update by OB/GYN with lowering of estrogen component of birth control. Counseling recommended to aid with CBT for OCD.  Additional offices contact information  provided. Maintain good screen time reduction and avoiding social media that contributes to feelings of social isolation maintain good sleep hygiene with consistent bedtime routines.  Continue with good physical activity working as well as improving outside time.  Maintain good dietary choices high in protein avoiding junk food and empty calories.  We discussed shift in weight due to change off of oral contraceptive.  Susan Dyer will reach out to me if she finds that she feels more controlling over food intake or feels that she is hyper focusing on her diet.  ADHD stable with medication management Has appropriate school accommodations with progress academically and I do recommend ongoing counseling for decreasing OCD behaviors as well as support with homosexuality.  DIAGNOSES:    ICD-10-CM   1. ADHD (attention deficit hyperactivity disorder), combined type  F90.2     2. Dysgraphia  R27.8     3. Generalized anxiety disorder  F41.1     4. Mixed obsessional thoughts and acts  F42.2     5. Medication management  Z79.899     6. Patient counseled  Z71.9       RECOMMENDATIONS:  Patient Instructions  DISCUSSION: Counseled regarding the following coordination of care items:  Continue medication as directed Vyvanse 70 mg and 30 mg every morning Prozac 20 and 10 mg every morning Adderall 10 m prn RX for above e-scribed and sent to pharmacy on record  [id number] [place],  - [address] [place] [address] Phone: [phone] Fax: [phone]  Advised importance of:  Sleep Maintain good sleep routines and set schedules Limited screen time (none on school nights, no more  than 2 hours on weekends) Reduce all screen time especially social media Regular exercise(outside and active play) Continue good physical activities Healthy eating (drink water, no sodas/sweet tea) Maintain good dietary protein and avoiding junk food and empty  calories   Counseling is recommended and may include Family counseling.  Consider the following options: [facility]  [email or web address] [phone]  Patient verbalized understanding of all topics discussed.  NEXT APPOINTMENT:  Return in about 3 months (around 03/22/2021) for Medical Follow up.  Disclaimer: This documentation was generated through the use of dictation and/or voice recognition software, and as such, may contain spelling or other transcription errors. Please disregard any inconsequential errors.  Any questions regarding the content of this documentation should be directed to the individual who electronically signed.

## 2021-01-11 ENCOUNTER — Other Ambulatory Visit: Payer: Self-pay

## 2021-01-11 MED ORDER — FLUOXETINE HCL 20 MG PO CAPS: 20.0000 mg | ORAL_CAPSULE | Freq: Every day | ORAL | 0 refills | Status: DC

## 2021-01-11 NOTE — Telephone Encounter (Signed)
Prozac 20 mg daily, # 90 with no RF's.RX for above e-scribed and sent to pharmacy on record  [facility] [phone] [place], [place] - [address] [place] [address] Phone: [phone] Fax: [phone]  [facility] [address], [place] - [address] [place] [address] Phone: [phone] Fax: [phone]

## 2021-01-23 ENCOUNTER — Other Ambulatory Visit: Payer: Self-pay

## 2021-01-23 MED ORDER — LISDEXAMFETAMINE DIMESYLATE 30 MG PO CAPS: 30.0000 mg | ORAL_CAPSULE | Freq: Every morning | ORAL | 0 refills | Status: DC

## 2021-01-23 MED ORDER — VYVANSE 70 MG PO CAPS: 70.0000 mg | ORAL_CAPSULE | Freq: Every morning | ORAL | 0 refills | Status: DC

## 2021-01-23 NOTE — Telephone Encounter (Signed)
RX for above e-scribed and sent to pharmacy on record  [id number] - [place], [place] - [address]  [place] [address] Phone: [phone] Fax: [phone] 

## 2021-01-27 ENCOUNTER — Other Ambulatory Visit: Payer: Self-pay | Admitting: Pediatrics

## 2021-01-28 MED ORDER — FLUOXETINE HCL 20 MG PO CAPS: 20.0000 mg | ORAL_CAPSULE | Freq: Every day | ORAL | 0 refills | Status: DC

## 2021-01-28 NOTE — Telephone Encounter (Signed)
E-Prescribed fluoxetine 10 + 20 = 30 directly to  [id number] [place], [place] - [address] [place] [address] Phone: [phone] Fax: [phone]

## 2021-02-22 ENCOUNTER — Telehealth: Payer: Self-pay | Admitting: Pediatrics

## 2021-02-22 MED ORDER — LISDEXAMFETAMINE DIMESYLATE 30 MG PO CAPS: 30.0000 mg | ORAL_CAPSULE | Freq: Every morning | ORAL | 0 refills | Status: DC

## 2021-02-22 MED ORDER — VYVANSE 70 MG PO CAPS: 70.0000 mg | ORAL_CAPSULE | Freq: Every morning | ORAL | 0 refills | Status: DC

## 2021-02-22 NOTE — Telephone Encounter (Signed)
Prescription refill request for Vyvanse 70 and Vyvanse 30 [facility] [address] [phone])

## 2021-02-22 NOTE — Telephone Encounter (Signed)
Vyvanse 70 mg and 30 mg each Rx # 30 with no RF's.RX for above e-scribed and sent to pharmacy on record  [facility] [phone] [place], [place] - [address] [place] [address] Phone: [phone] Fax: [phone]

## 2021-03-25 ENCOUNTER — Other Ambulatory Visit: Payer: Self-pay

## 2021-03-25 MED ORDER — VYVANSE 70 MG PO CAPS: 70.0000 mg | ORAL_CAPSULE | Freq: Every morning | ORAL | 0 refills | Status: DC

## 2021-03-25 MED ORDER — LISDEXAMFETAMINE DIMESYLATE 30 MG PO CAPS: 30.0000 mg | ORAL_CAPSULE | Freq: Every morning | ORAL | 0 refills | Status: DC

## 2021-03-25 NOTE — Telephone Encounter (Signed)
RX for above e-scribed and sent to pharmacy on record  [id number] - [place], [place] - [address] [place] [address] Phone: [phone] Fax: [phone] 

## 2021-04-09 ENCOUNTER — Telehealth (INDEPENDENT_AMBULATORY_CARE_PROVIDER_SITE_OTHER): Payer: BC Managed Care – PPO | Admitting: Pediatrics

## 2021-04-09 ENCOUNTER — Other Ambulatory Visit: Payer: Self-pay

## 2021-04-09 DIAGNOSIS — Z719 Counseling, unspecified: Secondary | ICD-10-CM

## 2021-04-09 DIAGNOSIS — F422 Mixed obsessional thoughts and acts: Secondary | ICD-10-CM

## 2021-04-09 DIAGNOSIS — R278 Other lack of coordination: Secondary | ICD-10-CM | POA: Diagnosis not present

## 2021-04-09 DIAGNOSIS — Z79899 Other long term (current) drug therapy: Secondary | ICD-10-CM

## 2021-04-09 DIAGNOSIS — Z7189 Other specified counseling: Secondary | ICD-10-CM

## 2021-04-09 DIAGNOSIS — F902 Attention-deficit hyperactivity disorder, combined type: Secondary | ICD-10-CM | POA: Diagnosis not present

## 2021-04-09 MED ORDER — FLUOXETINE HCL 10 MG PO CAPS: ORAL_CAPSULE | ORAL | 0 refills | Status: DC

## 2021-04-09 NOTE — Patient Instructions (Addendum)
DISCUSSION: Counseled regarding the following coordination of care items:  Vyvanse 70 mg / 30 mg every morning Prozac 20 mg - 30 mg every morning RX for above e-scribed and sent to pharmacy on record  [id number] [place], [place] - [address] [place] [address] Phone: [phone] Fax: [phone]   Transition of care to FNP for next follow-up in 6 months.

## 2021-04-09 NOTE — Progress Notes (Addendum)
[facility] [address], [place] [place] [address] Dept: [phone] Dept Fax: [phone]  Medication Check by Care[facility] due to COVID-19  Patient ID: Susan Dyer  DOB: [birth year]  MRN: [id number]  DATE:04/10/21 [name], MD  Interviewed: Trishia Condie    Location: [employer] apartment Provider location: [facility] office  Virtual Visit via Video Note Connected with [employer], 04/09/21 at 1500 by video enabled telemedicine application and verified that I am speaking with the correct person using two identifiers.     I discussed the limitations, risks, security and privacy concerns of performing an evaluation and management service by telephone and the availability of in person appointments. I also discussed with the parent/patient that there may be a patient responsible charge related to this service. The parent/patient expressed understanding and agreed to proceed.  HISTORY/CURRENT STATUS: Chief Complaint - Polite and cooperative and present for medical follow up for medication management of ADHD, dysgraphia and  learning differences.  Currently prescribed Vyvanse 70/30 mg with fluoxetine 20-30 mg daily.  Currently doing well at home and in school.  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  No Sleep concerns.  Elimination: No concerns  Individual Medical History/ Review of Systems: Changes? :Yes decreased estrogen component of OCP  Family Medical/ Social History: Changes? No  MENTAL HEALTH: Grumpy and angry - easily irritated, gets annoyed easily Had dropped down  estrogen to 20 mcg from 30 mcg. Counseled discussed increase back to 30 mcg  PHYSICAL EXAM; There were no vitals filed for this visit. There is no height or weight on file to calculate BMI.  General Physical Exam: Unchanged from previous exam, date:5/22   ASSESSMENT:  Liset is a 70-years of age with a diagnosis of ADHD/dysgraphia with anxiety and OCD that is largely improved.  Some irritability and snappish behaviors may be related to drop in estrogen to 20 mcg.  Counseled discussed with provider and determine if needed back 30 mcg.  Not using for birth control, using for mood stability. Continue counseling services through [facility]. Daily physical activities.  More physicality.  Protein rich diet avoiding junk food and empty calories.  Maintain good sleep routines avoiding late nights.  Always reduce screen time. ADHD stable with medication management Has appropriate school accommodations with progress academically We discussed transition to FNP at next visit.  They will call and schedule next visit.  DIAGNOSES:    ICD-10-CM   1. ADHD (attention deficit hyperactivity disorder), combined type  F90.2     2. Dysgraphia  R27.8     3. Mixed obsessional thoughts and acts  F42.2     4. Medication management  Z79.899     5. Patient counseled  Z71.9     6. Parenting dynamics counseling  Z71.89       RECOMMENDATIONS:  Patient Instructions  DISCUSSION: Counseled regarding the following coordination of care items:  Vyvanse 70 mg / 30 mg every morning Prozac 20 mg - 30 mg every morning RX for above e-scribed and sent to pharmacy on record  [id number] [place], [place] - [address] [place] [address] Phone: [phone] Fax: [phone]   Transition  of care to FNP for next follow-up in 6 months.     Patient verbalized understanding of all topics discussed.  NEXT APPOINTMENT:  No follow-ups on file.  Medical Decision-making:  I  spent 20 minutes dedicated to the care of this patient on the date of this encounter to include face to face time with the patient and/or parent reviewing medical records and documentation by teachers, performing and discussing the assessment and treatment plan, reviewing and explaining completed speciality labs and obtaining specialty lab samples.  The patient and/or parent was provided an opportunity to ask questions and all were answered. The patient and/or parent agreed with the plan and demonstrated an understanding of the instructions.   The patient and/or parent was advised to call back or seek an in-person evaluation if the symptoms worsen or if the condition fails to improve as anticipated.  I provided 20 minutes of non-face-to-face time during this encounter.   Completed record review for 10 minutes prior to and after the virtual visit.   Disclaimer: This documentation was generated through the use of dictation and/or voice recognition software, and as such, may contain spelling or other transcription errors. Please disregard any inconsequential errors.  Any questions regarding the content of this documentation should be directed to the individual who electronically signed.

## 2021-04-10 ENCOUNTER — Institutional Professional Consult (permissible substitution): Payer: BC Managed Care – PPO | Admitting: Pediatrics

## 2021-04-10 ENCOUNTER — Encounter: Payer: Self-pay | Admitting: Pediatrics

## 2021-04-26 ENCOUNTER — Other Ambulatory Visit: Payer: Self-pay

## 2021-04-26 MED ORDER — LISDEXAMFETAMINE DIMESYLATE 30 MG PO CAPS: 30.0000 mg | ORAL_CAPSULE | Freq: Every morning | ORAL | 0 refills | Status: DC

## 2021-04-26 MED ORDER — VYVANSE 70 MG PO CAPS: 70.0000 mg | ORAL_CAPSULE | Freq: Every morning | ORAL | 0 refills | Status: DC

## 2021-04-26 NOTE — Telephone Encounter (Signed)
Vyvanse 70 mg and 30 mg each rx for # 30 with no RF's.RX for above e-scribed and sent to pharmacy on record  [facility] [phone] [place], [place] - [address] [place] [address] Phone: [phone] Fax: [phone]

## 2021-06-06 ENCOUNTER — Other Ambulatory Visit: Payer: Self-pay

## 2021-06-06 MED ORDER — FLUOXETINE HCL 20 MG PO CAPS: 20.0000 mg | ORAL_CAPSULE | Freq: Every day | ORAL | 0 refills | Status: DC

## 2021-06-06 MED ORDER — VYVANSE 70 MG PO CAPS: 70.0000 mg | ORAL_CAPSULE | Freq: Every morning | ORAL | 0 refills | Status: DC

## 2021-06-06 MED ORDER — LISDEXAMFETAMINE DIMESYLATE 30 MG PO CAPS: 30.0000 mg | ORAL_CAPSULE | Freq: Every morning | ORAL | 0 refills | Status: DC

## 2021-06-06 NOTE — Telephone Encounter (Signed)
RX for above e-scribed and sent to pharmacy on record  [id number] - [place], [place] - [address] [place] [address] Phone: [phone] Fax: [phone] 

## 2021-06-20 ENCOUNTER — Encounter: Payer: Self-pay | Admitting: Family

## 2021-06-20 ENCOUNTER — Ambulatory Visit: Payer: BC Managed Care – PPO | Admitting: Family

## 2021-06-20 ENCOUNTER — Other Ambulatory Visit: Payer: Self-pay

## 2021-06-20 VITALS — BP 122/78 | Resp 16 | Ht 66.5 in | Wt 188.0 lb

## 2021-06-20 DIAGNOSIS — R278 Other lack of coordination: Secondary | ICD-10-CM | POA: Diagnosis not present

## 2021-06-20 DIAGNOSIS — F902 Attention-deficit hyperactivity disorder, combined type: Secondary | ICD-10-CM

## 2021-06-20 DIAGNOSIS — F429 Obsessive-compulsive disorder, unspecified: Secondary | ICD-10-CM | POA: Diagnosis not present

## 2021-06-20 DIAGNOSIS — R4589 Other symptoms and signs involving emotional state: Secondary | ICD-10-CM

## 2021-06-20 DIAGNOSIS — F411 Generalized anxiety disorder: Secondary | ICD-10-CM | POA: Diagnosis not present

## 2021-06-20 DIAGNOSIS — Z79899 Other long term (current) drug therapy: Secondary | ICD-10-CM

## 2021-06-20 DIAGNOSIS — Z7189 Other specified counseling: Secondary | ICD-10-CM

## 2021-06-20 MED ORDER — FLUOXETINE HCL 20 MG PO TABS: 30.0000 mg | ORAL_TABLET | Freq: Every day | ORAL | 2 refills | Status: DC

## 2021-06-20 NOTE — Progress Notes (Signed)
[facility] [address] [place] [address] Dept: [phone] Dept Fax: [phone] Loc: [phone] Loc Fax: [phone]  Medication Check  Patient ID: Susan Dyer, female  DOB: 08-31-2000, 21 y.o.  MRN: [id number]  Date of Evaluation: 06/30/2021 PCP: [name], MD  Accompanied by:  Self Patient Lives with:  2 roommates  now and moving out next semester. Also has a dog that is 84 months old and roommate has a smaller dog.   HISTORY/CURRENT STATUS: HPI Patient here by herself today for the visit. Patient interactive and appropriate with provider today. Patient with changes over the past 3 months related to decreased attention and anxiety with more OCD behaviors. Current medications have not been effective with her current ADHD, Anxiety, and OCD behaviors.   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   MEDICAL HISTORY: Appetite:None reported    MVI/Other: None   Sleep: Bedtime: 12:00-3:00 am  Awakens: 9:00 am  Concerns: Initiation/Maintenance/Other: No issues with sleep. Not wanting to go to bed and playing on her phone.   Individual Medical History/ Review of Systems: Changes? :None  Allergies: Patient has no known allergies.  Current Medications: Current Outpatient Medications  Medication Instructions   amphetamine-dextroamphetamine (ADDERALL) 10 MG tablet 10 mg, Oral, Daily PRN   FLUoxetine (PROZAC) 30 mg, Oral, Daily   lisdexamfetamine (VYVANSE) 30 mg, Oral, Every morning   Vyvanse 70 mg, Oral, Every  morning   Medication Side Effects: None Family Medical/ Social History: Changes? None  MENTAL HEALTH: Mental Health Issues: Depression and Anxiety-No counseling now and will continue with medication for symptom control   PHYSICAL EXAM; Vitals: Vitals:   06/20/21 1502  BP: 122/78  Resp: 16  Weight: 188 lb (85.3 kg)  Height: 5' 6.5" (1.689 m)    General Physical Exam: Unchanged from previous exam, date:04/09/2021 Changed:None  DIAGNOSES:    ICD-10-CM   1. ADHD (attention deficit hyperactivity disorder), combined type  F90.2     2. Dysgraphia  R27.8     3. Generalized anxiety disorder  F41.1     4. Obsessive-compulsive disorder, unspecified type  F42.9     5. Medication management  Z79.899     6. Depressed affect  R45.89     7. Goals of care, counseling/discussion  Z71.89      ASSESSMENT: Susan Dyer is a 21 year old female with a history of ADHD, Anxiety, Depressed affects and OCD behaviors. She is currently on Vyvanse 70 mg and 30 mg daily along with Prozac 30 mg total daily dose, Patient has taking on a regular basis but not as effective with current symptoms, but not wanting to change medications. OCD behaviors have been more consistent due to increased anxiety. Sleeping with no issues, but not going to sleep due to electronic use. Eating with no issues reported. No changes with health in the past 3 months. Has continued with academics at [employer] with success and continued support services with academics. Not currently working but will start back up with [employer] volunteering. Has new relationship for the past few months. NO changes today to medications or dosing. Will look at options for medication changes this summer after spring semester.  RECOMMENDATIONS:  Updates with school, classes, stressors, health and changes reported in the past several months.   Has continued with services at school through disability services for learning support.  Discussed continued symptoms  of anxiety and OCD behaviors that have been consistent.   Not currently getting counseling services for emotional regulation or symptom control. May consider counseling at a later date.   Some activity with volunteering with [employer] and walking some on campus, but no formal exercise or activity.  Sleep concerns discussed related to delayed on set due to electronic use. Reviewed turing off for better sleep quality and initiation at least 1 hour before bedtime.   Medication management discussed with limited efficacy with current medication regimen.  Patient refusing to changes medication during the semester, but consideration for changes this summer to consider.   The patient was provided an opportunity to ask questions and all were answered. The patient agreed with the plan and demonstrated an understanding of the instructions.  Counseled medication pharmacokinetics, options, dosage, administration, desired effects, and possible side effects.  Vyvanse 70 mg daily, no Rx today Vyvanse 30 mg daily, no Rx today Prozac 20 mg tablets 1 1/2 daily, # 45 with 2 RF's RX for above e-scribed and sent to pharmacy on record  [id number] [place], [place] - [address] [place] [address] Phone: [phone] Fax: [phone]   I discussed the assessment and treatment plan with the patient & parent. The patient & parent was provided an opportunity to ask questions and all were answered. The patient & parent agreed with the plan and demonstrated an understanding of the instructions.  NEXT APPOINTMENT: Return in about 6 months (around 12/18/2021) for f/u visit .  The patient was advised to call back or seek an in-person evaluation if the symptoms worsen or if the condition fails to improve as anticipated.   Carron Curie, NP

## 2021-06-30 ENCOUNTER — Encounter: Payer: Self-pay | Admitting: Family

## 2021-07-08 ENCOUNTER — Telehealth: Payer: Self-pay | Admitting: Family

## 2021-07-08 MED ORDER — VYVANSE 70 MG PO CAPS: 70.0000 mg | ORAL_CAPSULE | Freq: Every morning | ORAL | 0 refills | Status: DC

## 2021-07-08 MED ORDER — LISDEXAMFETAMINE DIMESYLATE 30 MG PO CAPS: 30.0000 mg | ORAL_CAPSULE | Freq: Every morning | ORAL | 0 refills | Status: DC

## 2021-07-08 NOTE — Telephone Encounter (Signed)
[facility] called for refill for her Vyvanse 70mg  &30mg . [place], [place] [address]  Phone:  [phone]

## 2021-07-08 NOTE — Telephone Encounter (Signed)
Vyvanse 70 mg and 30 mg each daily, # 30 for both Rx's with  no RF"s.RX for above e-scribed and sent to pharmacy on record  [phone] [place], [place] - [address] [place] [address] Phone: [phone] Fax: [phone]

## 2021-07-13 ENCOUNTER — Other Ambulatory Visit: Payer: Self-pay | Admitting: Family

## 2021-07-15 NOTE — Telephone Encounter (Signed)
Prozac 20 mg 1 1/2 daily, # 135 with 1 RF's.RX for above e-scribed and sent to pharmacy on record  [facility] [phone] [place], [place] - [address] [place] [address] Phone: [phone] Fax: [phone]

## 2021-08-06 ENCOUNTER — Other Ambulatory Visit: Payer: Self-pay

## 2021-08-06 MED ORDER — VYVANSE 70 MG PO CAPS: 70.0000 mg | ORAL_CAPSULE | Freq: Every morning | ORAL | 0 refills | Status: DC

## 2021-08-06 MED ORDER — LISDEXAMFETAMINE DIMESYLATE 30 MG PO CAPS: 30.0000 mg | ORAL_CAPSULE | Freq: Every morning | ORAL | 0 refills | Status: DC

## 2021-08-06 NOTE — Telephone Encounter (Signed)
Vyvanse 70 mg daily, # 30 with no RF's and Vyvanse 30 mg daily # 30 with no RF's.RX for above e-scribed and sent to pharmacy on record ? ?[id number] [place], [place] - [address] ?[address] ?[place] [address] ?Phone: [phone] Fax: [phone] ? ? ? ?

## 2021-08-20 IMAGING — CR DG CHEST 2V
2 series · 2 of 2 positions shown · non-contrast
Comparison: None.

CLINICAL DATA: Pain

EXAM:
CHEST - 2 VIEW

[w chest pa]
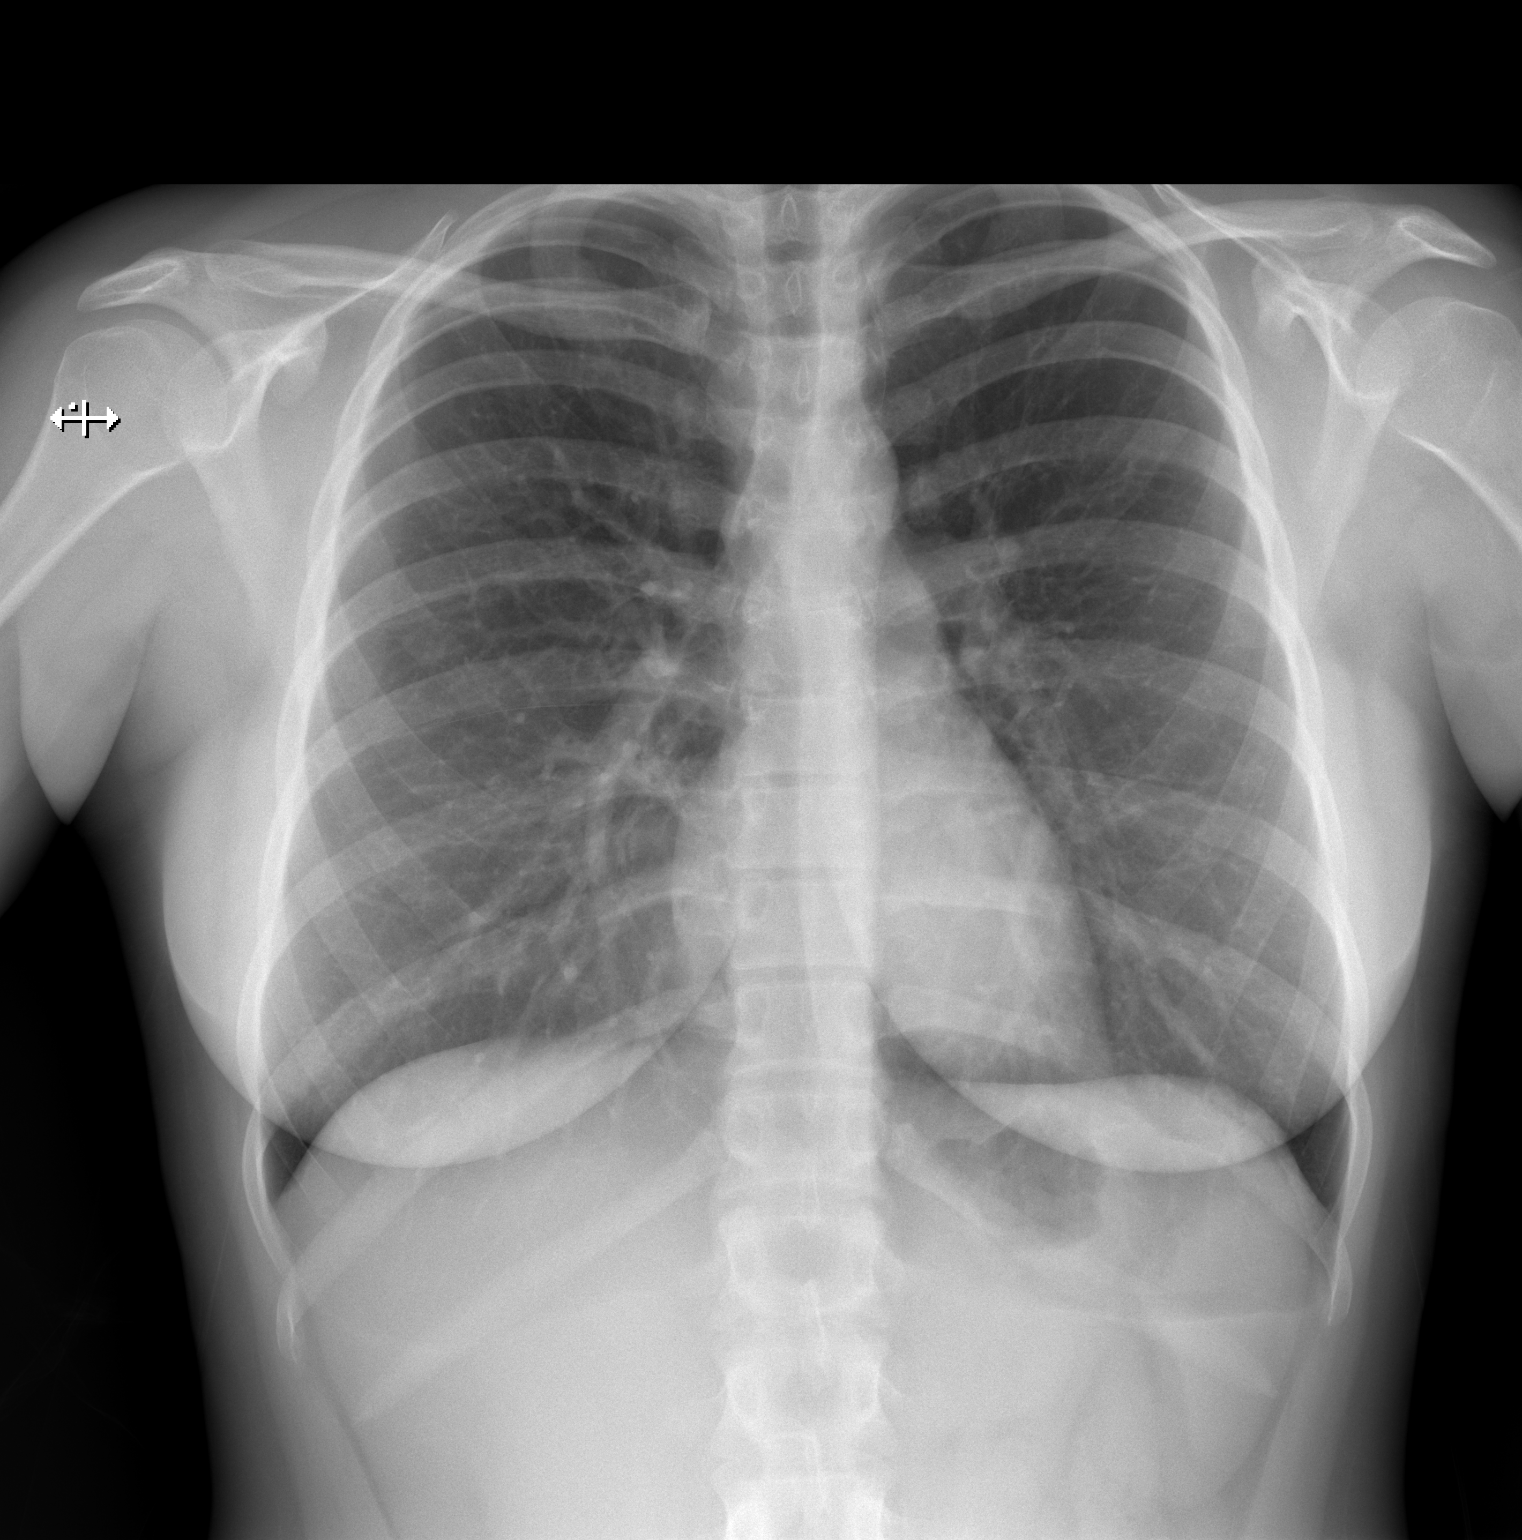

[w chest lat]
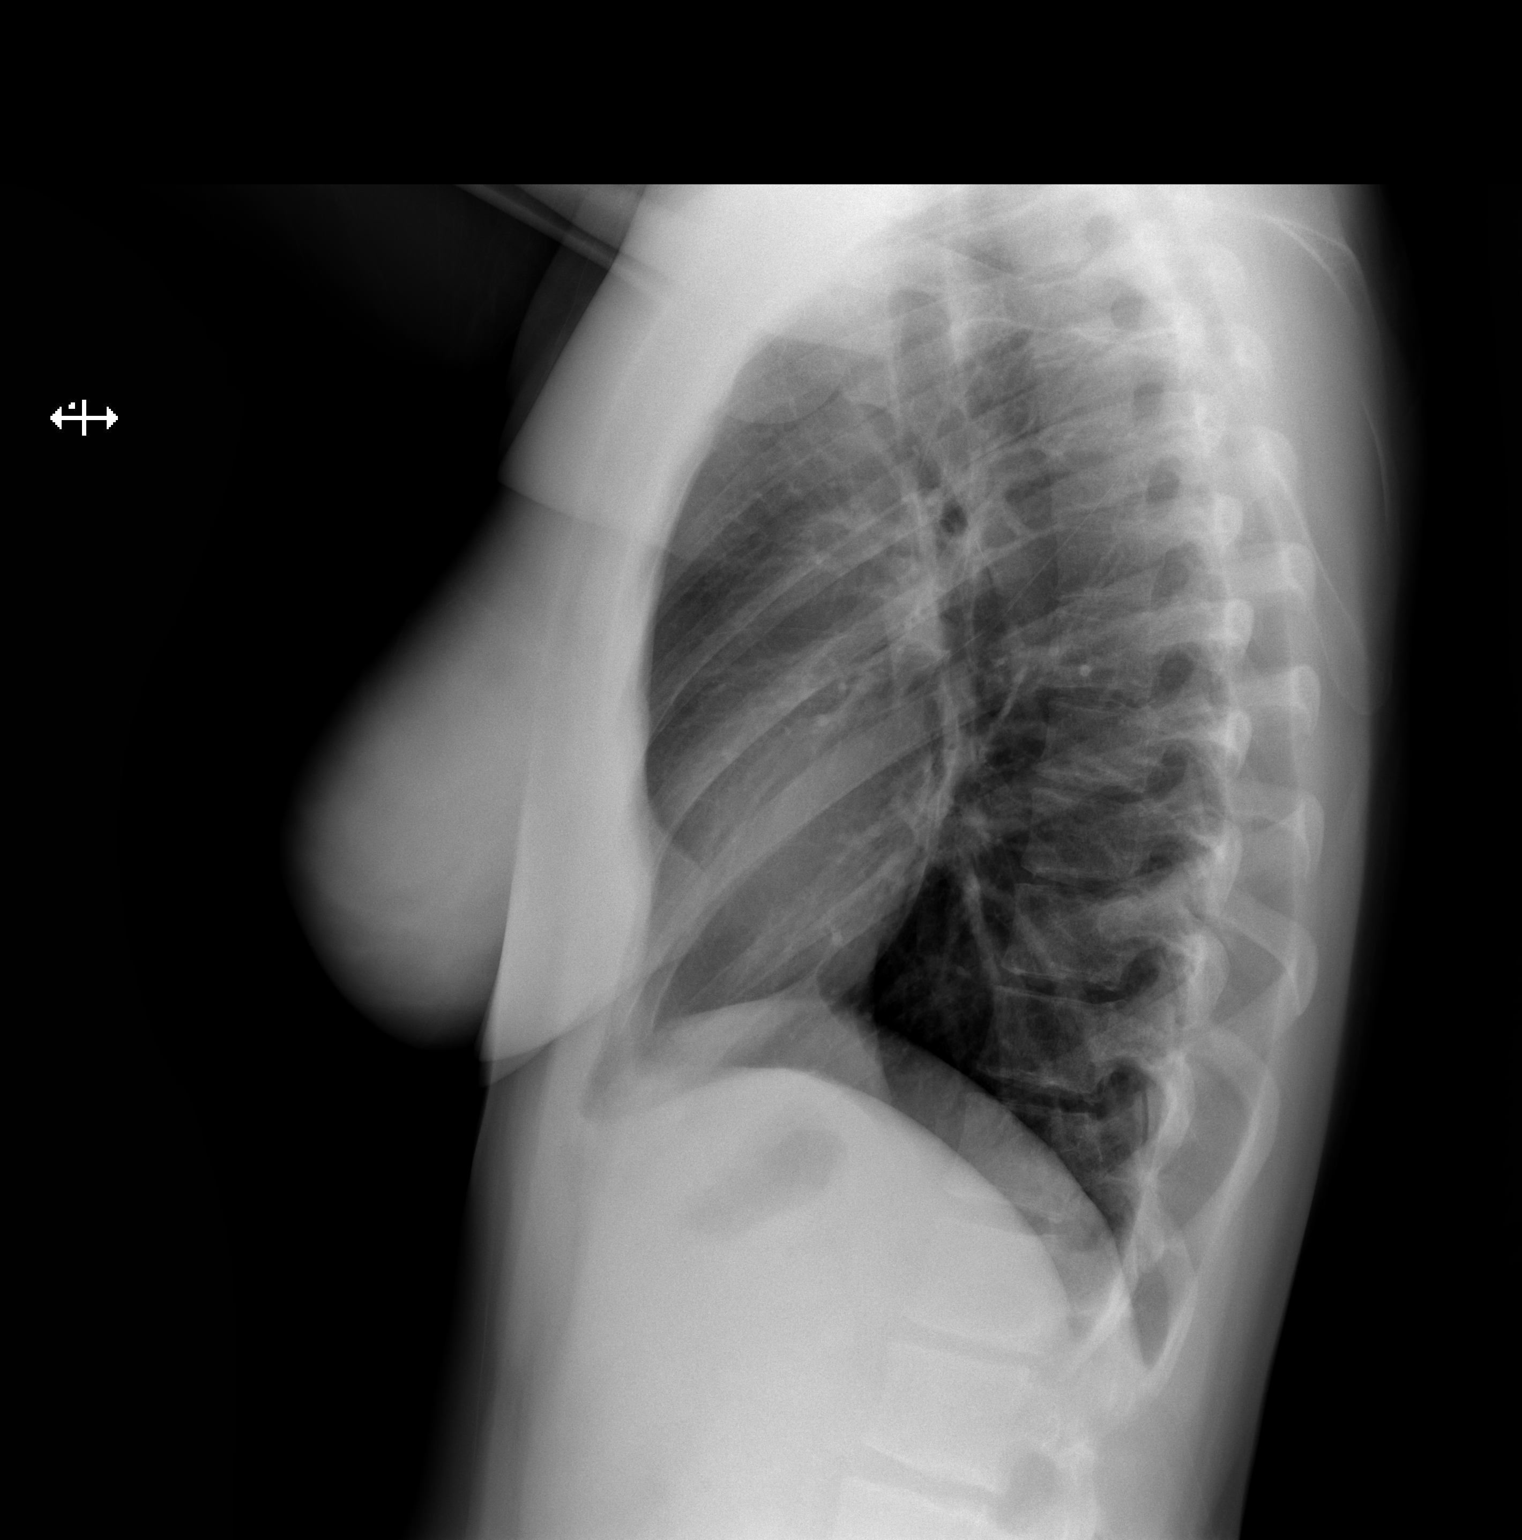

[2 of 2 positions shown; findings below may reference images not displayed]

FINDINGS: The heart size and mediastinal contours are within normal limits.
Both lungs are clear. The visualized skeletal structures are
unremarkable.
IMPRESSION: No active cardiopulmonary disease.

## 2021-09-09 ENCOUNTER — Other Ambulatory Visit: Payer: Self-pay

## 2021-09-10 ENCOUNTER — Telehealth: Payer: Self-pay | Admitting: Family

## 2021-09-10 MED ORDER — LISDEXAMFETAMINE DIMESYLATE 30 MG PO CAPS: 30.0000 mg | ORAL_CAPSULE | Freq: Every morning | ORAL | 0 refills | Status: DC

## 2021-09-10 MED ORDER — VYVANSE 70 MG PO CAPS: 70.0000 mg | ORAL_CAPSULE | Freq: Every morning | ORAL | 0 refills | Status: DC

## 2021-09-10 NOTE — Telephone Encounter (Signed)
Susan Dyer called in stating current pharamacy doesn't have medication in stock so she wants it sent to [facility] [address] ?

## 2021-09-10 NOTE — Telephone Encounter (Signed)
Vyvanse 70 mg daily and 30 mg daily # 30 each Rx with no RF's sent to different pharmacy due to being out of stock. RX for above e-scribed and sent to pharmacy on record ? ?[id number] [place], [place] - [phone] AT [facility] [address] ?[address] ?[place] [address] ?Phone: [phone] Fax: [phone] ? ? ?

## 2021-09-10 NOTE — Telephone Encounter (Signed)
Vyvanse 70 mg and 30 mg daily, #30 each rx with no RF's.RX for above e-scribed and sent to pharmacy on record ? ?[id number] [place], [place] - [address] ?[address] ?[place] [address] ?Phone: [phone] Fax: [phone] ? ? ? ?

## 2021-10-10 ENCOUNTER — Telehealth (INDEPENDENT_AMBULATORY_CARE_PROVIDER_SITE_OTHER): Payer: BC Managed Care – PPO | Admitting: Family

## 2021-10-10 ENCOUNTER — Encounter: Payer: Self-pay | Admitting: Family

## 2021-10-10 DIAGNOSIS — F411 Generalized anxiety disorder: Secondary | ICD-10-CM

## 2021-10-10 DIAGNOSIS — R278 Other lack of coordination: Secondary | ICD-10-CM

## 2021-10-10 DIAGNOSIS — F429 Obsessive-compulsive disorder, unspecified: Secondary | ICD-10-CM | POA: Diagnosis not present

## 2021-10-10 DIAGNOSIS — F902 Attention-deficit hyperactivity disorder, combined type: Secondary | ICD-10-CM

## 2021-10-10 DIAGNOSIS — Z79899 Other long term (current) drug therapy: Secondary | ICD-10-CM

## 2021-10-10 DIAGNOSIS — Z719 Counseling, unspecified: Secondary | ICD-10-CM

## 2021-10-10 DIAGNOSIS — Z7189 Other specified counseling: Secondary | ICD-10-CM

## 2021-10-10 DIAGNOSIS — R4589 Other symptoms and signs involving emotional state: Secondary | ICD-10-CM

## 2021-10-10 MED ORDER — LISDEXAMFETAMINE DIMESYLATE 30 MG PO CAPS: 30.0000 mg | ORAL_CAPSULE | Freq: Every morning | ORAL | 0 refills | Status: DC

## 2021-10-10 MED ORDER — VYVANSE 70 MG PO CAPS: 70.0000 mg | ORAL_CAPSULE | Freq: Every morning | ORAL | 0 refills | Status: DC

## 2021-10-10 NOTE — Progress Notes (Signed)
[facility] [address] [place] [place] [address] Dept: [phone] Dept Fax: [phone]  Medication Check visit via Virtual Video   Patient ID:  Susan Dyer  female DOB: 03/11/2001   21 y.o.   MRN: [id number]   DATE:10/10/21  PCP: [name], MD  Virtual Visit via Video Note  I connected with  Milon Score on 10/10/21 at  1:00 PM EDT by a video enabled telemedicine application and verified that I am speaking with the correct person using two identifiers. Patient/Parent Location: at work   I discussed the limitations, risks, security and privacy concerns of performing an evaluation and management service by telephone and the availability of in person appointments. I also discussed with the parents that there may be a patient responsible charge related to this service. The parents expressed understanding and agreed to proceed.  Provider: Carron Curie, NP  Location: private work location  HPI/CURRENT STATUS: Susan Dyer is here for medication management of the psychoactive medications for ADHD and review of educational and behavioral concerns.   Charnese currently taking Vyvanse 70 mg and 30 mg daily and Prozac at the same time, which is working well. Takes medication about 7-7:30. Medication tends to wear off around evening. Laurabeth is able to focus through school & homework.   Auri is eating well (eating breakfast, lunch and dinner). Keora does not have appetite suppression and eating out more often.   Sleeping well (goes to bed at 12:00 am wakes at 6:30-7:00 am), sleeping through the night. Natsumi does not have delayed sleep onset  EDUCATION: School: [employer] Year/Grade:  college   Performance/ Grades: average, failed calculus and will retake fall semester. Services: Other: disability services. Working: [employer]: full time 1/2 Tuesday, Wednesday, Thursday  and Friday and 1 weekend each month.  Activities/ Exercise: intermittently-moving more at the job and constantly on the go.  MEDICAL HISTORY: Individual Medical History/ Review of Systems: Yes, f/u with GYN  Has been healthy with no visits to the PCP. WCC due yearly.    Family Medical/ Social History:  Patient Lives with:  Apartment near [place] with 2 roommates that will move out in a month, puppy in the apartment.   MENTAL HEALTH: Mental Health Issues:   Depression and Anxiety  Prozac 30 mg daily with slightly better symptom control. No counseling at this time.   Allergies: No Known Allergies  Current Medications:  Current Outpatient Medications on File Prior to Visit  Medication Sig Dispense Refill   amphetamine-dextroamphetamine (ADDERALL) 10 MG tablet Take 1 tablet (10 mg total) by mouth daily as needed. 30 tablet 0   FLUoxetine (PROZAC) 20 MG tablet TAKE 1 AND 1/2 TABLETS DAILY BY MOUTH 135 tablet 1   Norethindrone Acetate-Ethinyl Estrad-FE (HAILEY 24 FE) 1-20 MG-MCG(24) tablet Take 1 tablet by mouth daily.     No current facility-administered medications on file prior to visit.   Medication Side Effects: None  DIAGNOSES:    ICD-10-CM   1. ADHD (attention deficit hyperactivity disorder), combined type  F90.2     2. Dysgraphia  R27.8     3. Generalized anxiety disorder  F41.1     4. Obsessive-compulsive disorder, unspecified type  F42.9     5. Medication management  Z79.899     6. Depressed affect  R45.89     7. Patient counseled  Z71.9     8. Goals of care, counseling/discussion  Z71.89  ASSESSMENT:      Susan Dyer is a 21 year old female with a history of ADHD, Dysgraphia, Anxiety, and Depression. Patient now taking Vyvanse 70 mg and 30 mg along with Prozac 30 mg daily with PRN dosing of Adderall 10 mg in the afternoon for homework. Better efficacy at the right dose now with her Prozac along with recent change of her BC back to a higher dose. Less anxiety and  stressors with school being over for the semester. Academically did well in all but her calculus class and will retake next semester. Disability services in place still at school. 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 Eating with no concerns but more fast food, but not as often. Very active at work and stays busy. Sleeping well with no current issues. Still has GF and spending every other weekend at her house. No changes today with medication or dosing.   PLAN/RECOMMENDATIONS:  Updates for school, academics, progress, classes and projection for graduation.   Discussed services at school and continuation of support for her learning needs.   Moving more and getting in daily activity with work. Eating more out, but not as much recently.   Discussed relationship with weekends between GF house and [place].   Recent visit to GYN for change in OC's to higher dose discussed and information provided for hormone regulation time.   Revisited the need for counseling and assistance with coping mechanisms. Discussed online or in person. Patient to look into her options.   Medication management discussed regarding her current symptom control of her ADHD, Anxiety and Depression.   Counseled medication pharmacokinetics, options, dosage, administration, desired effects, and possible side effects.   Vyvanse 70 mg daily, # 30 with no RF's Vyvanse 30 mg daily, # 30 with no RF's Prozac 20 mg 1 1/2 tablets daily, no Rx today Adderall 10 mg in the evening PRN, no Rx today RX for above e-scribed and sent to pharmacy on record  [id number] [place], [place] - [address] [place] [address] Phone: [phone] Fax: [phone]   I discussed the assessment and treatment plan with the patient. The patient was provided an opportunity to ask questions and all were answered. The patient agreed with the plan and demonstrated an understanding of the instructions.    NEXT APPOINTMENT:  01/23/2022-f/u visit  Telehealth OK  The patient was advised to call back or seek an in-person evaluation if the symptoms worsen or if the condition fails to improve as anticipated.  Carron Curie, NP

## 2021-11-08 ENCOUNTER — Other Ambulatory Visit: Payer: Self-pay

## 2021-11-08 MED ORDER — LISDEXAMFETAMINE DIMESYLATE 30 MG PO CAPS: 30.0000 mg | ORAL_CAPSULE | Freq: Every morning | ORAL | 0 refills | Status: DC

## 2021-11-08 MED ORDER — VYVANSE 70 MG PO CAPS: 70.0000 mg | ORAL_CAPSULE | Freq: Every morning | ORAL | 0 refills | Status: DC

## 2021-12-11 ENCOUNTER — Other Ambulatory Visit: Payer: Self-pay

## 2021-12-11 MED ORDER — VYVANSE 70 MG PO CAPS
70.0000 mg | ORAL_CAPSULE | Freq: Every morning | ORAL | 0 refills | Status: DC
Start: 2021-12-11 — End: 2022-01-02

## 2021-12-11 MED ORDER — LISDEXAMFETAMINE DIMESYLATE 30 MG PO CAPS
30.0000 mg | ORAL_CAPSULE | Freq: Every morning | ORAL | 0 refills | Status: DC
Start: 2021-12-11 — End: 2022-01-02

## 2021-12-11 NOTE — Telephone Encounter (Signed)
Vyvanse 70 mg and 30 mg daily daily, # 30 with no RF's for both Rx's.RX for above e-scribed and sent to pharmacy on record  [facility] [address] [place], [place] - [address] [place] [address] Phone: [phone] Fax: [phone]

## 2022-01-02 ENCOUNTER — Other Ambulatory Visit: Payer: Self-pay

## 2022-01-02 MED ORDER — VYVANSE 70 MG PO CAPS: 70.0000 mg | ORAL_CAPSULE | Freq: Every morning | ORAL | 0 refills | Status: DC

## 2022-01-02 MED ORDER — LISDEXAMFETAMINE DIMESYLATE 30 MG PO CAPS: 30.0000 mg | ORAL_CAPSULE | Freq: Every morning | ORAL | 0 refills | Status: DC

## 2022-01-02 NOTE — Telephone Encounter (Signed)
Vyvanse 70 mg and 30 mg daily, #30 each Rx with no RF's.RX for above e-scribed and sent to pharmacy on record  [id number] [place], [place] - [address] [place] [address]

## 2022-01-06 ENCOUNTER — Telehealth: Payer: Self-pay | Admitting: Family

## 2022-01-06 MED ORDER — VYVANSE 70 MG PO CAPS: 70.0000 mg | ORAL_CAPSULE | Freq: Every morning | ORAL | 0 refills | Status: DC

## 2022-01-06 NOTE — Telephone Encounter (Signed)
Vyvanse 70 mg daily, # 30 with no RF's.RX for above e-scribed and sent to pharmacy on record  [facility] [id number] [place], [place] - [address] [place] [address] Phone: [phone] Fax: [phone]

## 2022-01-06 NOTE — Telephone Encounter (Signed)
Susan Dyer called in stated the pharmacy didn't have vyvanse 70 mg in stock she wants it sent to [facility] on [address] instead.

## 2022-01-23 ENCOUNTER — Ambulatory Visit: Payer: BC Managed Care – PPO | Admitting: Family

## 2022-01-23 VITALS — BP 122/78 | HR 76 | Resp 16 | Ht 66.0 in | Wt 193.2 lb

## 2022-01-23 DIAGNOSIS — R4589 Other symptoms and signs involving emotional state: Secondary | ICD-10-CM

## 2022-01-23 DIAGNOSIS — Z719 Counseling, unspecified: Secondary | ICD-10-CM

## 2022-01-23 DIAGNOSIS — F429 Obsessive-compulsive disorder, unspecified: Secondary | ICD-10-CM | POA: Diagnosis not present

## 2022-01-23 DIAGNOSIS — Z79899 Other long term (current) drug therapy: Secondary | ICD-10-CM

## 2022-01-23 DIAGNOSIS — Z7689 Persons encountering health services in other specified circumstances: Secondary | ICD-10-CM

## 2022-01-23 DIAGNOSIS — Z7189 Other specified counseling: Secondary | ICD-10-CM

## 2022-01-23 DIAGNOSIS — F902 Attention-deficit hyperactivity disorder, combined type: Secondary | ICD-10-CM

## 2022-01-23 DIAGNOSIS — R278 Other lack of coordination: Secondary | ICD-10-CM | POA: Diagnosis not present

## 2022-01-23 DIAGNOSIS — F411 Generalized anxiety disorder: Secondary | ICD-10-CM

## 2022-01-23 MED ORDER — HYDROXYZINE HCL 10 MG PO TABS: 10.0000 mg | ORAL_TABLET | Freq: Three times a day (TID) | ORAL | 0 refills | Status: DC | PRN

## 2022-01-23 MED ORDER — SERTRALINE HCL 25 MG PO TABS: 25.0000 mg | ORAL_TABLET | Freq: Every day | ORAL | 0 refills | Status: DC

## 2022-01-23 NOTE — Patient Instructions (Addendum)
Prozac weaning directions-  1st day take only 1 tablet for the next 5 days, then 1/2 tablet for 5 days and stop after that last tablet. On the last 3 of the 5 days on 1/2 tablet can start with Zoloft 25 mg daily for at least 1 week and increase to 2 tablets daily.   Counseling  [employer] [facility]  [facility] Teacher, early years/pre

## 2022-01-23 NOTE — Progress Notes (Signed)
  [facility] [address] [place] [address] Dept: [phone] Dept Fax: [phone] Loc: [phone] Loc Fax: [phone]  Medication Check  Patient ID: Susan Dyer, female  DOB: Oct 26, 2000, 21 y.o.  MRN: [id number]  Date of Evaluation: 01/23/2022 PCP: [name], MD  Accompanied by:  self Patient Lives with:  2 roommates-both males  HISTORY/CURRENT STATUS: HPI Patient here by herself for the visit today. Patient interactive and appropriate with provider today. Patient with senior year for [employer] and anticipating  EDUCATION: School: [employer] Year/Grade: college Cal I with assistance class Pre-Vet or Biology/Science degree Homework Hours Spent: depends on the class demands Performance/ Grades: below average Services: Other: disability services Activities/ Exercise: intermittently with work and some on campus  Work: [employer] 18 hours/week  MEDICAL HISTORY: Appetite: No changes  MVI/Other: None  Sleep: Bedtime: 2400  Awakens: 0700  Concerns: Initiation/Maintenance/Other: better sleep now, but some nights only getting 5 hours/night  Individual Medical History/ Review of Systems: Changes? :Yes off Vyvanse for 1 week due to miscounting by pharmacy and unable to obtain due to [employer]. Felt less   Allergies: Patient has no known allergies.  Current Medications:  Current Outpatient Medications  Medication Instructions  . amphetamine-dextroamphetamine (ADDERALL) 10 MG tablet 10 mg, Oral, Daily PRN  . hydrOXYzine (ATARAX) 10 mg, Oral, 3 times daily PRN  . lisdexamfetamine (VYVANSE) 30 mg, Oral, Every morning  . Norethindrone Acetate-Ethinyl Estrad-FE (HAILEY 24 FE) 1-20 MG-MCG(24) tablet 1 tablet, Oral, Daily  . Vyvanse 70 mg, Oral, Every morning   Medication Side Effects: None Family Medical/ Social  History: Changes? No  MENTAL HEALTH: Mental Health Issues: Anxiety and OCD behaviors-limited efficacy for her symptoms with current Prozac dose.   PHYSICAL EXAM; Vitals: There were no vitals taken for this visit.  General Physical Exam: Unchanged from previous exam, date:10/10/2021 Changed:none  DIAGNOSES: No diagnosis found.  ASSESSMENT:  RECOMMENDATIONS: ***      NEXT APPOINTMENT: No follow-ups on file.    Carron Curie, NP

## 2022-01-26 ENCOUNTER — Encounter: Payer: Self-pay | Admitting: Family

## 2022-01-27 ENCOUNTER — Other Ambulatory Visit: Payer: Self-pay | Admitting: Family

## 2022-02-04 ENCOUNTER — Other Ambulatory Visit: Payer: Self-pay

## 2022-02-04 MED ORDER — LISDEXAMFETAMINE DIMESYLATE 30 MG PO CAPS: 30.0000 mg | ORAL_CAPSULE | Freq: Every morning | ORAL | 0 refills | Status: DC

## 2022-02-04 NOTE — Telephone Encounter (Signed)
Vyvanse 30 mg daily, # 30 with no RF's.RX for above e-scribed and sent to pharmacy on record  [id number] [place], [place] - [place] [address] Phone: [phone] Fax: [phone]

## 2022-02-04 NOTE — Telephone Encounter (Signed)
Patient [facility]mg  [facility] [address] and Vyvanse 70mg  sent to [facility]

## 2022-02-07 ENCOUNTER — Other Ambulatory Visit: Payer: Self-pay

## 2022-02-07 ENCOUNTER — Other Ambulatory Visit (HOSPITAL_COMMUNITY): Payer: Self-pay

## 2022-02-07 MED ORDER — VYVANSE 70 MG PO CAPS
70.0000 mg | ORAL_CAPSULE | Freq: Every morning | ORAL | 0 refills | Status: DC
  Filled 2022-02-07: qty 30, 30d supply, fill #0

## 2022-02-07 NOTE — Telephone Encounter (Signed)
Vyvanse 70 mg daily, #30 with no RF's.RX for above e-scribed and sent to pharmacy on record  [place] [facility] [address] [place] [address] Phone: [phone] Fax: [phone]   

## 2022-02-08 ENCOUNTER — Other Ambulatory Visit (HOSPITAL_COMMUNITY): Payer: Self-pay

## 2022-02-25 ENCOUNTER — Other Ambulatory Visit: Payer: Self-pay

## 2022-02-25 ENCOUNTER — Other Ambulatory Visit (HOSPITAL_COMMUNITY): Payer: Self-pay

## 2022-02-25 MED ORDER — VYVANSE 70 MG PO CAPS
70.0000 mg | ORAL_CAPSULE | Freq: Every morning | ORAL | 0 refills | Status: DC
  Filled ????-??-??: fill #0

## 2022-02-25 MED ORDER — LISDEXAMFETAMINE DIMESYLATE 30 MG PO CAPS
30.0000 mg | ORAL_CAPSULE | Freq: Every morning | ORAL | 0 refills | Status: DC
  Filled 2022-03-06 (×2): qty 30, 30d supply, fill #0

## 2022-02-25 NOTE — Telephone Encounter (Signed)
Vyvanse 70 mg and 30 mg each Rx #30 with no RF's.RX for above e-scribed and sent to pharmacy on record  [place] [address] Phone: [phone] Fax: [phone]

## 2022-02-26 ENCOUNTER — Other Ambulatory Visit: Payer: Self-pay | Admitting: Family

## 2022-02-26 NOTE — Telephone Encounter (Signed)
RX for above e-scribed and sent to pharmacy on record  [id number] [place], [place] - [address] [place] [address] Phone: [phone] Fax: [phone] 

## 2022-03-03 ENCOUNTER — Other Ambulatory Visit (HOSPITAL_COMMUNITY): Payer: Self-pay

## 2022-03-06 ENCOUNTER — Other Ambulatory Visit (HOSPITAL_COMMUNITY): Payer: Self-pay

## 2022-03-07 ENCOUNTER — Other Ambulatory Visit: Payer: Self-pay

## 2022-03-07 ENCOUNTER — Other Ambulatory Visit (HOSPITAL_COMMUNITY): Payer: Self-pay

## 2022-03-07 MED ORDER — LISDEXAMFETAMINE DIMESYLATE 70 MG PO CAPS
70.0000 mg | ORAL_CAPSULE | Freq: Every morning | ORAL | 0 refills | Status: DC
  Filled 2022-03-10 (×4): qty 30, 30d supply, fill #0

## 2022-03-07 NOTE — Telephone Encounter (Signed)
Vyvanse 70 mg daily, #30 with no RF's.Grayce Sessions for above e-scribed and sent to pharmacy on record  [place] [address] [place] [address] Phone: [phone] Fax: [phone]

## 2022-03-08 ENCOUNTER — Other Ambulatory Visit (HOSPITAL_COMMUNITY): Payer: Self-pay

## 2022-03-10 ENCOUNTER — Telehealth: Payer: Self-pay | Admitting: Family

## 2022-03-10 ENCOUNTER — Other Ambulatory Visit (HOSPITAL_COMMUNITY): Payer: Self-pay

## 2022-03-10 MED ORDER — LISDEXAMFETAMINE DIMESYLATE 30 MG PO CAPS
30.0000 mg | ORAL_CAPSULE | Freq: Every morning | ORAL | 0 refills | Status: DC
  Filled 2022-03-10: qty 30, 30d supply, fill #0

## 2022-03-10 NOTE — Telephone Encounter (Signed)
Kateria needs refill for vyvanse generic 70mg  sent to [facility].

## 2022-03-10 NOTE — Telephone Encounter (Signed)
E-Prescribed generic Vyvanse 70 directly to  [facility] [address] [place] [address] Phone: [phone] Fax: [phone]

## 2022-04-03 ENCOUNTER — Other Ambulatory Visit: Payer: Self-pay

## 2022-04-03 ENCOUNTER — Other Ambulatory Visit (HOSPITAL_COMMUNITY): Payer: Self-pay

## 2022-04-03 MED ORDER — LISDEXAMFETAMINE DIMESYLATE 70 MG PO CAPS
70.0000 mg | ORAL_CAPSULE | Freq: Every morning | ORAL | 0 refills | Status: DC
  Filled 2022-04-03 – 2022-04-07 (×2): qty 30, 30d supply, fill #0

## 2022-04-03 MED ORDER — LISDEXAMFETAMINE DIMESYLATE 30 MG PO CAPS
30.0000 mg | ORAL_CAPSULE | Freq: Every morning | ORAL | 0 refills | Status: DC
  Filled 2022-04-03 – 2022-04-04 (×2): qty 30, 30d supply, fill #0

## 2022-04-03 NOTE — Progress Notes (Signed)
Vyvanse 70 mg daily and 30 mg daily each Rx #30 with no RF's for both Rx's.RX for above e-scribed and sent to pharmacy on record  [place] [facility] [address] [place] [address] Phone: [phone] Fax: [phone]

## 2022-04-04 ENCOUNTER — Other Ambulatory Visit (HOSPITAL_COMMUNITY): Payer: Self-pay

## 2022-04-04 ENCOUNTER — Other Ambulatory Visit: Payer: Self-pay

## 2022-04-04 NOTE — Telephone Encounter (Signed)
Error

## 2022-04-07 ENCOUNTER — Other Ambulatory Visit (HOSPITAL_COMMUNITY): Payer: Self-pay

## 2022-05-06 ENCOUNTER — Other Ambulatory Visit: Payer: Self-pay

## 2022-05-06 ENCOUNTER — Other Ambulatory Visit (HOSPITAL_COMMUNITY): Payer: Self-pay

## 2022-05-06 MED ORDER — LISDEXAMFETAMINE DIMESYLATE 70 MG PO CAPS
70.0000 mg | ORAL_CAPSULE | Freq: Every morning | ORAL | 0 refills | Status: DC
  Filled 2022-05-06: qty 30, 30d supply, fill #0

## 2022-05-06 MED ORDER — LISDEXAMFETAMINE DIMESYLATE 30 MG PO CAPS
30.0000 mg | ORAL_CAPSULE | Freq: Every morning | ORAL | 0 refills | Status: DC
  Filled 2022-05-06: qty 30, 30d supply, fill #0

## 2022-05-06 NOTE — Telephone Encounter (Signed)
Vyvanse 70 mg daily #30 with no RF's and Vyvanse 30 mg daily, #30 with no RF's.RX for above e-scribed and sent to pharmacy on record  [place] [facility] [address] [place] [address] Phone: [phone] Fax: [phone]

## 2022-05-07 ENCOUNTER — Other Ambulatory Visit (HOSPITAL_COMMUNITY): Payer: Self-pay

## 2022-05-07 ENCOUNTER — Telehealth: Payer: BC Managed Care – PPO | Admitting: Family

## 2022-05-13 ENCOUNTER — Encounter: Payer: Self-pay | Admitting: Family

## 2022-05-13 ENCOUNTER — Telehealth (INDEPENDENT_AMBULATORY_CARE_PROVIDER_SITE_OTHER): Payer: BC Managed Care – PPO | Admitting: Family

## 2022-05-13 DIAGNOSIS — F411 Generalized anxiety disorder: Secondary | ICD-10-CM

## 2022-05-13 DIAGNOSIS — F902 Attention-deficit hyperactivity disorder, combined type: Secondary | ICD-10-CM | POA: Diagnosis not present

## 2022-05-13 DIAGNOSIS — R278 Other lack of coordination: Secondary | ICD-10-CM | POA: Diagnosis not present

## 2022-05-13 DIAGNOSIS — Z79899 Other long term (current) drug therapy: Secondary | ICD-10-CM

## 2022-05-13 DIAGNOSIS — F429 Obsessive-compulsive disorder, unspecified: Secondary | ICD-10-CM

## 2022-05-13 DIAGNOSIS — Z719 Counseling, unspecified: Secondary | ICD-10-CM

## 2022-05-13 DIAGNOSIS — Z7189 Other specified counseling: Secondary | ICD-10-CM

## 2022-05-13 NOTE — Progress Notes (Signed)
[facility] [address] [place] [place] [address] Dept: [phone] Dept Fax: [phone]  Medication Check visit via Virtual Video   Patient ID:  Susan Dyer  female DOB: 05/16/2001   21 y.o.   MRN: [id number]   DATE:05/13/22  PCP: [name], MD Virtual Visit via Video Note  I connected with  Vena on 05/13/22 at  3:00 PM EST by a video enabled telemedicine application and verified that I am speaking with the correct person using two identifiers. Patient/Parent Location: work location  I discussed the limitations, risks, security and privacy concerns of performing an evaluation and management service by telephone and the availability of in person appointments. I also discussed with the parents that there may be a patient responsible charge related to this service. The parents expressed understanding and agreed to proceed.  Provider: Carron Curie, NP  Location: private work location  HPI/CURRENT STATUS: Gelena is here for medication management of the psychoactive medications for ADHD and review of educational and behavioral concerns.   Nyna currently taking Vyvanse  which is working well. Takes medication as directed daily. Medication tends to wear off around evening time. Saryn is able to focus through school & homework.   Cathrine is eating well (eating breakfast, lunch and dinner). Raffaella does not have appetite suppression  Sleeping well (getting plenty of sleep), sleeping through the night. Dwanna does not have delayed sleep onset and did not take the Hydroxyzine at all.   EDUCATION: School: [employer] Year/Grade:  college   Performance/ Grades: average Services: Other: Disability Services Work: [employer] 30-38 hours/week Will go back down to 3 days/week when school starts Activities/ Exercise: intermittently  MEDICAL HISTORY: Individual Medical History/  Review of Systems: Yes, COVID recently and seen urgent care for treatment. Has been healthy with no visits to the PCP. WCC due yearly and as PRN.   Family Medical/ Social History: Recently got engaged [employer] Lives with:  2 roommates and now trying to get a house.   MENTAL HEALTH: Mental Health Issues: Anxiety and seeing a therapist that started recently at [facility], [place]. Zoloft 25 mg daily with no issues reported.   Allergies: No Known Allergies  Current Medications:  Current Outpatient Medications  Medication Instructions   hydrOXYzine (ATARAX) 10 mg, Oral, 3 times daily PRN   lisdexamfetamine (VYVANSE) 70 mg, Oral, Every morning   lisdexamfetamine (VYVANSE) 30 mg, Oral, Every morning   Norethindrone Acetate-Ethinyl Estrad-FE (HAILEY 24 FE) 1-20 MG-MCG(24) tablet 1 tablet, Oral, Daily   sertraline (ZOLOFT) 25 mg, Oral, Daily   Medication Side Effects: None  DIAGNOSES:    ICD-10-CM   1. ADHD (attention deficit hyperactivity disorder), combined type  F90.2     2. Dysgraphia  R27.8     3. Generalized anxiety disorder  F41.1     4. Obsessive-compulsive disorder, unspecified type  F42.9     5. Medication management  Z79.899     6. Patient counseled  Z71.9     7. Goals of care, counseling/discussion  Z71.89     ASSESSMENT:      Alyssamae is a 21 year old female with a history of ADHD, Anxiety, Depression and Dysgraphia. She has been maintained on Vyvanse 70 mg  with 30 mg and Zoloft 25 mg daily with efficacy. Academically had issues with her math class last semester, but passed with a "D." Next semester with have more difficult classes with more science and  labs. Working at [employer] more during the time off of school and change in hours for less time when school restarts. Anxiety has continued but more controlled with Zoloft 25 mg started at the last visit. Started seeing a therapist at the [facility] recently. Got engaged to her GF recently. Eating with no  concerns, Sleeping well with no reported issues and has not used the Hydroxyzine. May consider adjusting the dose of the Zoloft before school starts for the spring semester.   PLAN/RECOMMENDATIONS:  Updates with school and progression over the fall semester with grades.   Discussed next semester with anticipatory classes along with credit hours.   Current disability services are in place at [facility] for learning and academic support.  Working at the [employer] has continued with increased hours recently and lower hours for the spring semester.   Healthcare update provided with recent viral illness but no other changes reported.   Discussed recent engagement to her GF since the last f/u visit.   Supported continuation of therapy with recent start with [place] at [facility].   Sleep habits with no changes reported recently by patient. Has not used the Hydroxyzine.   Medication management discussed with patient and dose adjustment as needed.  Consider taking an increased dose of Zoloft with instruction to adjust the dose to 25 mg tablets 1.5 daily over the next 1-2 weeks.   Counseled medication pharmacokinetics, options, dosage, administration, desired effects, and possible side effects.   Zoloft 25 mg 1.5 tablets daily with no Rx today Vyvanse 70 mg daily, no Rx today Vyvanse 30 mg daily, no Rx today Hydroxyzine 10 mg PRN, no Rx today   I discussed the assessment and treatment plan with Falesha. Marcey was provided an opportunity to ask questions and all were answered. Krislyn agreed with the plan and demonstrated an understanding of the instructions.  REVIEW OF CHART, FACE TO FACE CLINIC TIME AND DOCUMENTATION TIME DURING TODAY'S VISIT:  32 mins      NEXT APPOINTMENT:  07/31/2022    Telehealth OK  The patient was advised to call back or seek an in-person evaluation if the symptoms worsen or if the condition fails to improve as anticipated.  Carron Curie, NP

## 2022-06-04 ENCOUNTER — Other Ambulatory Visit: Payer: Self-pay

## 2022-06-04 MED ORDER — SERTRALINE HCL 25 MG PO TABS
25.0000 mg | ORAL_TABLET | Freq: Every day | ORAL | 1 refills | Status: AC
  Filled 2022-06-04: qty 90, 90d supply, fill #0

## 2022-06-04 MED ORDER — LISDEXAMFETAMINE DIMESYLATE 30 MG PO CAPS
30.0000 mg | ORAL_CAPSULE | Freq: Every morning | ORAL | 0 refills | Status: DC
  Filled 2022-06-04: qty 30, 30d supply, fill #0

## 2022-06-04 MED ORDER — LISDEXAMFETAMINE DIMESYLATE 70 MG PO CAPS
70.0000 mg | ORAL_CAPSULE | Freq: Every morning | ORAL | 0 refills | Status: DC
  Filled 2022-06-04: qty 30, 30d supply, fill #0

## 2022-06-04 MED ORDER — HYDROXYZINE HCL 10 MG PO TABS
10.0000 mg | ORAL_TABLET | Freq: Three times a day (TID) | ORAL | 0 refills | Status: DC | PRN
  Filled 2022-06-04: qty 30, 10d supply, fill #0

## 2022-06-04 NOTE — Telephone Encounter (Signed)
RX for above e-scribed and sent to pharmacy on record  [place] [facility] [address] [place] [address] Phone: [phone] Fax: [phone]   

## 2022-06-05 ENCOUNTER — Other Ambulatory Visit (HOSPITAL_COMMUNITY): Payer: Self-pay

## 2022-06-05 ENCOUNTER — Other Ambulatory Visit: Payer: Self-pay

## 2022-06-05 MED ORDER — LISDEXAMFETAMINE DIMESYLATE 70 MG PO CAPS
70.0000 mg | ORAL_CAPSULE | Freq: Every morning | ORAL | 0 refills | Status: DC
  Filled 2022-06-05: qty 30, 30d supply, fill #0

## 2022-06-05 NOTE — Telephone Encounter (Signed)
Sent Vyvanse 70 mg daily, #30 with no RF"s to [facility] due to being out of stock at [facility].

## 2022-06-05 NOTE — Telephone Encounter (Signed)
[facility] does not have Vyvanse 70mg  in stock patient would like it sent to [facility]

## 2022-06-25 ENCOUNTER — Other Ambulatory Visit (HOSPITAL_COMMUNITY): Payer: Self-pay

## 2022-06-25 ENCOUNTER — Other Ambulatory Visit: Payer: Self-pay

## 2022-06-25 MED ORDER — HYDROXYZINE HCL 10 MG PO TABS
10.0000 mg | ORAL_TABLET | Freq: Three times a day (TID) | ORAL | 3 refills | Status: AC | PRN
  Filled 2022-06-25: qty 30, 10d supply, fill #0

## 2022-06-25 MED ORDER — LISDEXAMFETAMINE DIMESYLATE 70 MG PO CAPS
70.0000 mg | ORAL_CAPSULE | Freq: Every morning | ORAL | 0 refills | Status: DC
  Filled 2022-06-25: qty 30, 30d supply, fill #0

## 2022-06-25 MED ORDER — LISDEXAMFETAMINE DIMESYLATE 30 MG PO CAPS
30.0000 mg | ORAL_CAPSULE | Freq: Every morning | ORAL | 0 refills | Status: DC
  Filled 2022-06-25: qty 30, 30d supply, fill #0

## 2022-06-25 NOTE — Telephone Encounter (Signed)
Vyvanse 70 mg and 30 mg #30 each Rx with no RF's and Hydroxyzine 10 mg up 3 daily #30 with 3 RF's.Grayce Sessions for above e-scribed and sent to pharmacy on record  [place] [address] Phone: [phone] Fax: [phone]

## 2022-06-30 ENCOUNTER — Telehealth: Payer: Self-pay | Admitting: Family

## 2022-06-30 MED ORDER — LISDEXAMFETAMINE DIMESYLATE 30 MG PO CAPS
30.0000 mg | ORAL_CAPSULE | Freq: Every day | ORAL | 0 refills | Status: AC
  Filled 2022-09-19: qty 30, 30d supply, fill #0

## 2022-06-30 MED ORDER — LISDEXAMFETAMINE DIMESYLATE 70 MG PO CAPS
70.0000 mg | ORAL_CAPSULE | Freq: Every day | ORAL | 0 refills | Status: AC
  Filled 2022-08-04: qty 30, 30d supply, fill #0

## 2022-06-30 MED ORDER — LISDEXAMFETAMINE DIMESYLATE 70 MG PO CAPS
70.0000 mg | ORAL_CAPSULE | Freq: Every day | ORAL | 0 refills | Status: AC
  Filled 2022-09-03: qty 30, 30d supply, fill #0

## 2022-06-30 MED ORDER — LISDEXAMFETAMINE DIMESYLATE 30 MG PO CAPS
30.0000 mg | ORAL_CAPSULE | Freq: Every day | ORAL | 0 refills | Status: AC
  Filled 2022-08-04: qty 30, 30d supply, fill #0

## 2022-06-30 MED ORDER — LISDEXAMFETAMINE DIMESYLATE 70 MG PO CAPS
70.0000 mg | ORAL_CAPSULE | Freq: Every day | ORAL | 0 refills | Status: DC
  Filled 2022-10-02: qty 30, 30d supply, fill #0
  Filled 2022-10-03: qty 20, 20d supply, fill #0
  Filled 2022-10-03: qty 10, 10d supply, fill #0

## 2022-06-30 MED ORDER — LISDEXAMFETAMINE DIMESYLATE 30 MG PO CAPS
30.0000 mg | ORAL_CAPSULE | Freq: Every day | ORAL | 0 refills | Status: DC
  Filled 2022-10-31: qty 30, 30d supply, fill #0

## 2022-06-30 NOTE — Telephone Encounter (Signed)
Vyvanse 70 mg and 30 mg daily, #30 with no RF's each Rx. Post dated for 07/24/22, 08/24/22, & 09/23/22.RX for above e-scribed and sent to pharmacy on record  [place] [address] [place] [address] Phone: [phone] Fax: [phone]

## 2022-07-01 ENCOUNTER — Other Ambulatory Visit (HOSPITAL_COMMUNITY): Payer: Self-pay

## 2022-07-02 ENCOUNTER — Telehealth: Payer: Self-pay | Admitting: Family

## 2022-07-02 ENCOUNTER — Other Ambulatory Visit (HOSPITAL_COMMUNITY): Payer: Self-pay

## 2022-07-02 MED ORDER — LISDEXAMFETAMINE DIMESYLATE 30 MG PO CAPS: 30.0000 mg | ORAL_CAPSULE | Freq: Every morning | ORAL | 0 refills | Status: DC

## 2022-07-02 NOTE — Telephone Encounter (Signed)
Vyvanse 70 mg daily, #30 with no RF's. Sent to different pharmacy due to lack of stock at [facility], .RX for above e-scribed and sent to pharmacy on record  [id number] [place] [address] [place]Phone: [phone]: [phone]

## 2022-07-03 ENCOUNTER — Telehealth: Payer: Self-pay | Admitting: Family

## 2022-07-03 ENCOUNTER — Other Ambulatory Visit (HOSPITAL_COMMUNITY): Payer: Self-pay

## 2022-07-03 MED ORDER — LISDEXAMFETAMINE DIMESYLATE 70 MG PO CAPS: 70.0000 mg | ORAL_CAPSULE | Freq: Every morning | ORAL | 0 refills | Status: AC

## 2022-07-03 NOTE — Telephone Encounter (Signed)
Resent Rx for Vvyanse 70 mg daily #30 with no RF's.RX for above e-scribed and sent to pharmacy on record  [id number]- [place] [address] [place]Phone: [phone]: [phone]

## 2022-07-04 ENCOUNTER — Other Ambulatory Visit (HOSPITAL_COMMUNITY): Payer: Self-pay

## 2022-07-04 MED ORDER — LISDEXAMFETAMINE DIMESYLATE 30 MG PO CAPS
30.0000 mg | ORAL_CAPSULE | Freq: Every morning | ORAL | 0 refills | Status: AC
  Filled 2022-07-04: qty 30, 30d supply, fill #0

## 2022-07-04 NOTE — Telephone Encounter (Signed)
Vyvanse 30 mg only available at [facility].  Resent RX for above e-scribed and sent to pharmacy on record.

## 2022-07-31 ENCOUNTER — Institutional Professional Consult (permissible substitution): Payer: BC Managed Care – PPO | Admitting: Family

## 2022-08-04 ENCOUNTER — Other Ambulatory Visit (HOSPITAL_COMMUNITY): Payer: Self-pay

## 2022-08-06 ENCOUNTER — Other Ambulatory Visit (HOSPITAL_COMMUNITY): Payer: Self-pay

## 2022-08-19 ENCOUNTER — Other Ambulatory Visit (HOSPITAL_COMMUNITY): Payer: Self-pay

## 2022-08-20 ENCOUNTER — Other Ambulatory Visit (HOSPITAL_COMMUNITY): Payer: Self-pay

## 2022-09-02 ENCOUNTER — Other Ambulatory Visit (HOSPITAL_COMMUNITY): Payer: Self-pay

## 2022-09-03 ENCOUNTER — Other Ambulatory Visit (HOSPITAL_COMMUNITY): Payer: Self-pay

## 2022-09-03 ENCOUNTER — Other Ambulatory Visit: Payer: Self-pay

## 2022-09-19 ENCOUNTER — Other Ambulatory Visit (HOSPITAL_COMMUNITY): Payer: Self-pay

## 2022-10-02 ENCOUNTER — Other Ambulatory Visit (HOSPITAL_COMMUNITY): Payer: Self-pay

## 2022-10-03 ENCOUNTER — Other Ambulatory Visit (HOSPITAL_COMMUNITY): Payer: Self-pay

## 2022-10-31 ENCOUNTER — Other Ambulatory Visit (HOSPITAL_COMMUNITY): Payer: Self-pay

## 2022-11-02 ENCOUNTER — Other Ambulatory Visit (HOSPITAL_COMMUNITY): Payer: Self-pay

## 2022-11-02 MED ORDER — LISDEXAMFETAMINE DIMESYLATE 70 MG PO CAPS
70.0000 mg | ORAL_CAPSULE | Freq: Every morning | ORAL | 0 refills | Status: DC
  Filled 2022-11-02: qty 30, 30d supply, fill #0

## 2022-11-03 ENCOUNTER — Other Ambulatory Visit (HOSPITAL_COMMUNITY): Payer: Self-pay

## 2022-11-04 ENCOUNTER — Other Ambulatory Visit (HOSPITAL_COMMUNITY): Payer: Self-pay

## 2022-12-03 ENCOUNTER — Other Ambulatory Visit: Payer: Self-pay

## 2022-12-03 ENCOUNTER — Other Ambulatory Visit (HOSPITAL_COMMUNITY): Payer: Self-pay

## 2022-12-03 MED ORDER — LISDEXAMFETAMINE DIMESYLATE 70 MG PO CAPS
70.0000 mg | ORAL_CAPSULE | Freq: Every morning | ORAL | 0 refills | Status: DC
  Filled 2022-12-03: qty 30, 30d supply, fill #0

## 2022-12-03 MED ORDER — LISDEXAMFETAMINE DIMESYLATE 30 MG PO CAPS
30.0000 mg | ORAL_CAPSULE | Freq: Every morning | ORAL | 0 refills | Status: DC
  Filled 2022-12-03: qty 30, 30d supply, fill #0

## 2023-01-01 ENCOUNTER — Other Ambulatory Visit (HOSPITAL_COMMUNITY): Payer: Self-pay

## 2023-01-01 MED ORDER — LISDEXAMFETAMINE DIMESYLATE 30 MG PO CAPS
30.0000 mg | ORAL_CAPSULE | Freq: Every morning | ORAL | 0 refills | Status: DC
  Filled 2023-01-01: qty 30, 30d supply, fill #0

## 2023-01-01 MED ORDER — LISDEXAMFETAMINE DIMESYLATE 70 MG PO CAPS
70.0000 mg | ORAL_CAPSULE | Freq: Every morning | ORAL | 0 refills | Status: DC
  Filled 2023-01-01: qty 30, 30d supply, fill #0

## 2023-01-02 ENCOUNTER — Other Ambulatory Visit (HOSPITAL_COMMUNITY): Payer: Self-pay

## 2023-01-30 ENCOUNTER — Other Ambulatory Visit (HOSPITAL_COMMUNITY): Payer: Self-pay

## 2023-01-30 MED ORDER — LISDEXAMFETAMINE DIMESYLATE 30 MG PO CAPS
30.0000 mg | ORAL_CAPSULE | Freq: Every morning | ORAL | 0 refills | Status: DC
  Filled 2023-02-02: qty 10, 10d supply, fill #0
  Filled 2023-02-02: qty 20, 20d supply, fill #0

## 2023-01-30 MED ORDER — LISDEXAMFETAMINE DIMESYLATE 70 MG PO CAPS
70.0000 mg | ORAL_CAPSULE | Freq: Every morning | ORAL | 0 refills | Status: DC
  Filled 2023-02-02: qty 30, 30d supply, fill #0

## 2023-02-02 ENCOUNTER — Other Ambulatory Visit (HOSPITAL_COMMUNITY): Payer: Self-pay

## 2023-03-02 ENCOUNTER — Other Ambulatory Visit (HOSPITAL_COMMUNITY): Payer: Self-pay

## 2023-03-02 MED ORDER — SERTRALINE HCL 25 MG PO TABS
37.5000 mg | ORAL_TABLET | Freq: Every day | ORAL | 2 refills | Status: AC
Start: 2023-03-02 — End: ?
  Filled 2023-03-02: qty 45, 30d supply, fill #0
  Filled 2023-05-04: qty 45, 30d supply, fill #1

## 2023-03-02 MED ORDER — LISDEXAMFETAMINE DIMESYLATE 30 MG PO CAPS
30.0000 mg | ORAL_CAPSULE | Freq: Every morning | ORAL | 0 refills | Status: DC
Start: 2023-03-02 — End: 2023-03-31
  Filled 2023-03-02: qty 30, 30d supply, fill #0

## 2023-03-02 MED ORDER — LISDEXAMFETAMINE DIMESYLATE 70 MG PO CAPS
70.0000 mg | ORAL_CAPSULE | Freq: Every morning | ORAL | 0 refills | Status: DC
Start: 2023-03-02 — End: 2023-03-31
  Filled 2023-03-02: qty 30, 30d supply, fill #0

## 2023-03-31 ENCOUNTER — Other Ambulatory Visit (HOSPITAL_COMMUNITY): Payer: Self-pay

## 2023-03-31 MED ORDER — LISDEXAMFETAMINE DIMESYLATE 30 MG PO CAPS
30.0000 mg | ORAL_CAPSULE | Freq: Every morning | ORAL | 0 refills | Status: DC
Start: 2023-03-31 — End: 2023-04-30
  Filled 2023-03-31: qty 30, 30d supply, fill #0

## 2023-03-31 MED ORDER — LISDEXAMFETAMINE DIMESYLATE 70 MG PO CAPS
70.0000 mg | ORAL_CAPSULE | Freq: Every morning | ORAL | 0 refills | Status: DC
  Filled 2023-03-31: qty 30, 30d supply, fill #0

## 2023-04-02 ENCOUNTER — Other Ambulatory Visit (HOSPITAL_COMMUNITY): Payer: Self-pay

## 2023-04-30 ENCOUNTER — Other Ambulatory Visit (HOSPITAL_COMMUNITY): Payer: Self-pay

## 2023-04-30 MED ORDER — LISDEXAMFETAMINE DIMESYLATE 70 MG PO CAPS
70.0000 mg | ORAL_CAPSULE | Freq: Every morning | ORAL | 0 refills | Status: DC
Start: 2023-04-30 — End: 2023-06-03
  Filled 2023-04-30: qty 30, 30d supply, fill #0

## 2023-04-30 MED ORDER — LISDEXAMFETAMINE DIMESYLATE 30 MG PO CAPS
30.0000 mg | ORAL_CAPSULE | Freq: Every morning | ORAL | 0 refills | Status: DC
Start: 2023-04-30 — End: 2023-06-03
  Filled 2023-04-30: qty 30, 30d supply, fill #0

## 2023-05-04 ENCOUNTER — Other Ambulatory Visit (HOSPITAL_COMMUNITY): Payer: Self-pay

## 2023-05-05 ENCOUNTER — Other Ambulatory Visit (HOSPITAL_COMMUNITY): Payer: Self-pay

## 2023-05-05 MED ORDER — NYSTATIN-TRIAMCINOLONE 100000-0.1 UNIT/GM-% EX CREA
TOPICAL_CREAM | Freq: Two times a day (BID) | CUTANEOUS | 2 refills | Status: AC | PRN
  Filled 2023-05-05: qty 30, 30d supply, fill #0

## 2023-05-15 ENCOUNTER — Other Ambulatory Visit (HOSPITAL_COMMUNITY): Payer: Self-pay

## 2023-06-03 ENCOUNTER — Other Ambulatory Visit (HOSPITAL_COMMUNITY): Payer: Self-pay

## 2023-06-03 MED ORDER — NITROFURANTOIN MONOHYD MACRO 100 MG PO CAPS
100.0000 mg | ORAL_CAPSULE | Freq: Two times a day (BID) | ORAL | 0 refills | Status: AC
  Filled 2023-06-03: qty 10, 5d supply, fill #0

## 2023-06-03 MED ORDER — LISDEXAMFETAMINE DIMESYLATE 30 MG PO CAPS
30.0000 mg | ORAL_CAPSULE | Freq: Every morning | ORAL | 0 refills | Status: DC
Start: 2023-06-03 — End: 2023-07-03
  Filled 2023-06-03: qty 30, 30d supply, fill #0

## 2023-06-03 MED ORDER — LISDEXAMFETAMINE DIMESYLATE 70 MG PO CAPS
70.0000 mg | ORAL_CAPSULE | Freq: Every morning | ORAL | 0 refills | Status: DC
  Filled 2023-06-03: qty 10, 10d supply, fill #0
  Filled 2023-06-03: qty 20, 20d supply, fill #0

## 2023-06-10 ENCOUNTER — Other Ambulatory Visit (HOSPITAL_COMMUNITY): Payer: Self-pay

## 2023-06-10 MED ORDER — SERTRALINE HCL 25 MG PO TABS
37.5000 mg | ORAL_TABLET | Freq: Every day | ORAL | 0 refills | Status: AC
  Filled 2023-06-10: qty 45, 30d supply, fill #0

## 2023-06-18 ENCOUNTER — Other Ambulatory Visit (HOSPITAL_COMMUNITY): Payer: Self-pay

## 2023-06-18 MED ORDER — SERTRALINE HCL 100 MG PO TABS
100.0000 mg | ORAL_TABLET | Freq: Every day | ORAL | 1 refills | Status: AC
  Filled 2023-06-18 – 2023-07-10 (×4): qty 30, 30d supply, fill #0

## 2023-06-30 ENCOUNTER — Other Ambulatory Visit (HOSPITAL_COMMUNITY): Payer: Self-pay

## 2023-07-01 ENCOUNTER — Other Ambulatory Visit (HOSPITAL_COMMUNITY): Payer: Self-pay

## 2023-07-01 ENCOUNTER — Encounter (HOSPITAL_COMMUNITY): Payer: Self-pay

## 2023-07-03 ENCOUNTER — Other Ambulatory Visit: Payer: Self-pay

## 2023-07-03 ENCOUNTER — Other Ambulatory Visit (HOSPITAL_COMMUNITY): Payer: Self-pay

## 2023-07-03 MED ORDER — LISDEXAMFETAMINE DIMESYLATE 70 MG PO CAPS
70.0000 mg | ORAL_CAPSULE | Freq: Every morning | ORAL | 0 refills | Status: DC
  Filled 2023-07-03 (×2): qty 30, 30d supply, fill #0

## 2023-07-03 MED ORDER — LISDEXAMFETAMINE DIMESYLATE 30 MG PO CAPS
30.0000 mg | ORAL_CAPSULE | Freq: Every morning | ORAL | 0 refills | Status: DC
  Filled 2023-07-03 (×2): qty 30, 30d supply, fill #0

## 2023-07-10 ENCOUNTER — Other Ambulatory Visit (HOSPITAL_COMMUNITY): Payer: Self-pay

## 2023-08-04 ENCOUNTER — Other Ambulatory Visit (HOSPITAL_COMMUNITY): Payer: Self-pay

## 2023-08-04 MED ORDER — LISDEXAMFETAMINE DIMESYLATE 30 MG PO CAPS
30.0000 mg | ORAL_CAPSULE | Freq: Every morning | ORAL | 0 refills | Status: DC
  Filled 2023-08-04: qty 30, 30d supply, fill #0

## 2023-08-04 MED ORDER — LISDEXAMFETAMINE DIMESYLATE 70 MG PO CAPS
70.0000 mg | ORAL_CAPSULE | Freq: Every morning | ORAL | 0 refills | Status: DC
  Filled 2023-08-04: qty 30, 30d supply, fill #0

## 2023-08-11 ENCOUNTER — Other Ambulatory Visit (HOSPITAL_COMMUNITY): Payer: Self-pay

## 2023-08-11 MED ORDER — SERTRALINE HCL 100 MG PO TABS
100.0000 mg | ORAL_TABLET | Freq: Two times a day (BID) | ORAL | 2 refills | Status: AC
  Filled 2023-08-11 – 2023-08-14 (×2): qty 60, 30d supply, fill #0
  Filled 2023-10-03: qty 60, 30d supply, fill #1
  Filled 2024-04-15: qty 60, 30d supply, fill #2

## 2023-08-14 ENCOUNTER — Other Ambulatory Visit (HOSPITAL_COMMUNITY): Payer: Self-pay

## 2023-09-03 ENCOUNTER — Other Ambulatory Visit (HOSPITAL_COMMUNITY): Payer: Self-pay

## 2023-09-03 MED ORDER — LISDEXAMFETAMINE DIMESYLATE 30 MG PO CAPS
30.0000 mg | ORAL_CAPSULE | Freq: Every morning | ORAL | 0 refills | Status: DC
  Filled 2023-09-03: qty 30, 30d supply, fill #0

## 2023-09-03 MED ORDER — LISDEXAMFETAMINE DIMESYLATE 70 MG PO CAPS
70.0000 mg | ORAL_CAPSULE | Freq: Every morning | ORAL | 0 refills | Status: DC
  Filled 2023-09-03: qty 30, 30d supply, fill #0

## 2023-10-02 ENCOUNTER — Other Ambulatory Visit (HOSPITAL_COMMUNITY): Payer: Self-pay

## 2023-10-02 MED ORDER — LISDEXAMFETAMINE DIMESYLATE 70 MG PO CAPS
70.0000 mg | ORAL_CAPSULE | Freq: Every morning | ORAL | 0 refills | Status: DC
  Filled 2023-10-02 – 2023-10-03 (×2): qty 30, 30d supply, fill #0

## 2023-10-02 MED ORDER — LISDEXAMFETAMINE DIMESYLATE 30 MG PO CAPS
30.0000 mg | ORAL_CAPSULE | Freq: Every morning | ORAL | 0 refills | Status: DC
  Filled 2023-10-02 – 2023-10-03 (×2): qty 30, 30d supply, fill #0

## 2023-10-03 ENCOUNTER — Other Ambulatory Visit (HOSPITAL_COMMUNITY): Payer: Self-pay

## 2023-10-05 ENCOUNTER — Other Ambulatory Visit (HOSPITAL_COMMUNITY): Payer: Self-pay

## 2023-10-30 ENCOUNTER — Other Ambulatory Visit (HOSPITAL_COMMUNITY): Payer: Self-pay

## 2023-10-30 MED ORDER — LISDEXAMFETAMINE DIMESYLATE 30 MG PO CAPS
30.0000 mg | ORAL_CAPSULE | Freq: Every morning | ORAL | 0 refills | Status: DC
  Filled 2023-10-30 – 2023-10-31 (×2): qty 30, 30d supply, fill #0

## 2023-10-30 MED ORDER — LISDEXAMFETAMINE DIMESYLATE 70 MG PO CAPS
70.0000 mg | ORAL_CAPSULE | Freq: Every morning | ORAL | 0 refills | Status: DC
  Filled 2023-10-30 – 2023-10-31 (×2): qty 30, 30d supply, fill #0

## 2023-10-31 ENCOUNTER — Other Ambulatory Visit (HOSPITAL_COMMUNITY): Payer: Self-pay

## 2023-11-10 ENCOUNTER — Other Ambulatory Visit (HOSPITAL_COMMUNITY): Payer: Self-pay

## 2023-11-10 MED ORDER — SERTRALINE HCL 100 MG PO TABS
100.0000 mg | ORAL_TABLET | Freq: Two times a day (BID) | ORAL | 2 refills | Status: AC
  Filled 2023-11-10: qty 60, 30d supply, fill #0

## 2023-11-10 MED ORDER — SERTRALINE HCL 100 MG PO TABS
100.0000 mg | ORAL_TABLET | Freq: Two times a day (BID) | ORAL | 2 refills | Status: AC
  Filled 2023-11-10 – 2024-01-02 (×2): qty 60, 30d supply, fill #0
  Filled 2024-03-22: qty 60, 30d supply, fill #1

## 2023-11-16 ENCOUNTER — Other Ambulatory Visit (HOSPITAL_COMMUNITY): Payer: Self-pay

## 2023-12-02 ENCOUNTER — Other Ambulatory Visit (HOSPITAL_COMMUNITY): Payer: Self-pay

## 2023-12-02 MED ORDER — LISDEXAMFETAMINE DIMESYLATE 70 MG PO CAPS
70.0000 mg | ORAL_CAPSULE | Freq: Every morning | ORAL | 0 refills | Status: DC
  Filled 2023-12-02: qty 30, 30d supply, fill #0

## 2023-12-02 MED ORDER — LISDEXAMFETAMINE DIMESYLATE 30 MG PO CAPS
30.0000 mg | ORAL_CAPSULE | Freq: Every morning | ORAL | 0 refills | Status: DC
  Filled 2023-12-02: qty 30, 30d supply, fill #0

## 2023-12-31 ENCOUNTER — Other Ambulatory Visit (HOSPITAL_COMMUNITY): Payer: Self-pay

## 2023-12-31 MED ORDER — LISDEXAMFETAMINE DIMESYLATE 70 MG PO CAPS
70.0000 mg | ORAL_CAPSULE | Freq: Every morning | ORAL | 0 refills | Status: AC
  Filled 2023-12-31: qty 30, 30d supply, fill #0

## 2023-12-31 MED ORDER — LISDEXAMFETAMINE DIMESYLATE 30 MG PO CAPS
30.0000 mg | ORAL_CAPSULE | Freq: Every morning | ORAL | 0 refills | Status: AC
  Filled 2023-12-31: qty 30, 30d supply, fill #0

## 2024-01-02 ENCOUNTER — Other Ambulatory Visit (HOSPITAL_COMMUNITY): Payer: Self-pay

## 2024-01-02 MED ORDER — LISDEXAMFETAMINE DIMESYLATE 70 MG PO CAPS
70.0000 mg | ORAL_CAPSULE | Freq: Every morning | ORAL | 0 refills | Status: AC
  Filled 2024-01-02: qty 30, 30d supply, fill #0

## 2024-01-02 MED ORDER — LISDEXAMFETAMINE DIMESYLATE 30 MG PO CAPS
30.0000 mg | ORAL_CAPSULE | Freq: Every morning | ORAL | 0 refills | Status: AC
  Filled 2024-01-02: qty 30, 30d supply, fill #0

## 2024-01-04 ENCOUNTER — Other Ambulatory Visit (HOSPITAL_COMMUNITY): Payer: Self-pay

## 2024-02-01 ENCOUNTER — Other Ambulatory Visit (HOSPITAL_COMMUNITY): Payer: Self-pay

## 2024-02-01 MED ORDER — LISDEXAMFETAMINE DIMESYLATE 30 MG PO CAPS
30.0000 mg | ORAL_CAPSULE | Freq: Every morning | ORAL | 0 refills | Status: AC
  Filled 2024-02-01: qty 30, 30d supply, fill #0

## 2024-02-01 MED ORDER — LISDEXAMFETAMINE DIMESYLATE 70 MG PO CAPS
70.0000 mg | ORAL_CAPSULE | Freq: Every morning | ORAL | 0 refills | Status: AC
  Filled 2024-02-01: qty 30, 30d supply, fill #0

## 2024-02-01 MED ORDER — SERTRALINE HCL 25 MG PO TABS
37.5000 mg | ORAL_TABLET | Freq: Every day | ORAL | 2 refills | Status: AC
  Filled 2024-02-01: qty 45, 30d supply, fill #0

## 2024-02-02 ENCOUNTER — Other Ambulatory Visit (HOSPITAL_COMMUNITY): Payer: Self-pay

## 2024-03-04 ENCOUNTER — Other Ambulatory Visit (HOSPITAL_COMMUNITY): Payer: Self-pay

## 2024-03-04 MED ORDER — LISDEXAMFETAMINE DIMESYLATE 30 MG PO CAPS
30.0000 mg | ORAL_CAPSULE | Freq: Every morning | ORAL | 0 refills | Status: AC
  Filled 2024-03-04: qty 30, 30d supply, fill #0

## 2024-03-04 MED ORDER — LISDEXAMFETAMINE DIMESYLATE 70 MG PO CAPS
70.0000 mg | ORAL_CAPSULE | Freq: Every morning | ORAL | 0 refills | Status: AC
  Filled 2024-03-04: qty 30, 30d supply, fill #0

## 2024-03-22 ENCOUNTER — Other Ambulatory Visit (HOSPITAL_COMMUNITY): Payer: Self-pay

## 2024-03-23 ENCOUNTER — Other Ambulatory Visit (HOSPITAL_COMMUNITY): Payer: Self-pay

## 2024-04-04 ENCOUNTER — Other Ambulatory Visit (HOSPITAL_COMMUNITY): Payer: Self-pay

## 2024-04-04 MED ORDER — LISDEXAMFETAMINE DIMESYLATE 30 MG PO CAPS
30.0000 mg | ORAL_CAPSULE | Freq: Every morning | ORAL | 0 refills | Status: AC
  Filled 2024-04-04: qty 30, 30d supply, fill #0

## 2024-04-04 MED ORDER — LISDEXAMFETAMINE DIMESYLATE 70 MG PO CAPS
70.0000 mg | ORAL_CAPSULE | Freq: Every morning | ORAL | 0 refills | Status: AC
  Filled 2024-04-04: qty 30, 30d supply, fill #0

## 2024-04-06 ENCOUNTER — Other Ambulatory Visit (HOSPITAL_COMMUNITY): Payer: Self-pay

## 2024-04-06 MED ORDER — SERTRALINE HCL 100 MG PO TABS
100.0000 mg | ORAL_TABLET | Freq: Two times a day (BID) | ORAL | 2 refills | Status: AC
  Filled 2024-05-13: qty 60, 30d supply, fill #0

## 2024-04-25 ENCOUNTER — Other Ambulatory Visit (HOSPITAL_COMMUNITY): Payer: Self-pay

## 2024-04-28 ENCOUNTER — Other Ambulatory Visit (HOSPITAL_COMMUNITY): Payer: Self-pay

## 2024-04-28 MED ORDER — LISDEXAMFETAMINE DIMESYLATE 30 MG PO CAPS
30.0000 mg | ORAL_CAPSULE | Freq: Every morning | ORAL | 0 refills | Status: AC
  Filled 2024-05-04 – 2024-05-05 (×2): qty 30, 30d supply, fill #0

## 2024-04-29 ENCOUNTER — Other Ambulatory Visit (HOSPITAL_COMMUNITY): Payer: Self-pay

## 2024-05-04 ENCOUNTER — Other Ambulatory Visit: Payer: Self-pay

## 2024-05-04 ENCOUNTER — Other Ambulatory Visit (HOSPITAL_COMMUNITY): Payer: Self-pay

## 2024-05-04 MED FILL — Lisdexamfetamine Dimesylate Cap 70 MG: 70.0000 mg | ORAL | 30 days supply | Qty: 30 | Fill #0 | Status: CN

## 2024-05-05 MED FILL — Lisdexamfetamine Dimesylate Cap 70 MG: 70.0000 mg | ORAL | 30 days supply | Qty: 30 | Fill #0 | Status: AC

## 2024-05-13 ENCOUNTER — Other Ambulatory Visit (HOSPITAL_COMMUNITY): Payer: Self-pay

## 2024-06-03 ENCOUNTER — Other Ambulatory Visit (HOSPITAL_COMMUNITY): Payer: Self-pay

## 2024-06-03 MED ORDER — LISDEXAMFETAMINE DIMESYLATE 70 MG PO CAPS
70.0000 mg | ORAL_CAPSULE | Freq: Every morning | ORAL | 0 refills | Status: AC
  Filled 2024-06-03: qty 30, 30d supply, fill #0

## 2024-06-03 MED ORDER — LISDEXAMFETAMINE DIMESYLATE 30 MG PO CAPS
30.0000 mg | ORAL_CAPSULE | Freq: Every morning | ORAL | 0 refills | Status: AC
  Filled 2024-06-03: qty 30, 30d supply, fill #0

## 2024-06-04 ENCOUNTER — Other Ambulatory Visit (HOSPITAL_COMMUNITY): Payer: Self-pay

## 2024-06-04 MED ORDER — LISDEXAMFETAMINE DIMESYLATE 30 MG PO CAPS
30.0000 mg | ORAL_CAPSULE | Freq: Every morning | ORAL | 0 refills | Status: DC
  Filled 2024-06-04: qty 30, 30d supply, fill #0
